# Patient Record
Sex: Female | Born: 1940 | Race: White | Hispanic: No | Marital: Married | Smoking: Former smoker
Health system: Southern US, Community
[De-identification: ages and names within clinical notes are randomized; demographics above are authoritative.]

## PROBLEM LIST (undated history)

## (undated) DIAGNOSIS — E039 Hypothyroidism, unspecified: Secondary | ICD-10-CM

## (undated) DIAGNOSIS — N201 Calculus of ureter: Secondary | ICD-10-CM

## (undated) DIAGNOSIS — F4310 Post-traumatic stress disorder, unspecified: Secondary | ICD-10-CM

## (undated) DIAGNOSIS — F419 Anxiety disorder, unspecified: Secondary | ICD-10-CM

## (undated) DIAGNOSIS — E785 Hyperlipidemia, unspecified: Secondary | ICD-10-CM

## (undated) HISTORY — PX: TONSILLECTOMY: SUR1361

## (undated) HISTORY — PX: APPENDECTOMY: SHX54

---

## 1989-12-28 HISTORY — PX: TOTAL ABDOMINAL HYSTERECTOMY W/ BILATERAL SALPINGOOPHORECTOMY: SHX83

## 2011-04-30 ENCOUNTER — Other Ambulatory Visit (HOSPITAL_COMMUNITY): Payer: Self-pay | Admitting: Family Medicine

## 2011-04-30 DIAGNOSIS — Z139 Encounter for screening, unspecified: Secondary | ICD-10-CM

## 2011-05-04 ENCOUNTER — Ambulatory Visit (HOSPITAL_COMMUNITY)
Admission: RE | Admit: 2011-05-04 | Discharge: 2011-05-04 | Disposition: A | Discharge status: Home / Self Care | Payer: Medicare (Managed Care) | Source: Ambulatory Visit | Attending: Family Medicine | Admitting: Family Medicine

## 2011-05-04 DIAGNOSIS — Z1231 Encounter for screening mammogram for malignant neoplasm of breast: Secondary | ICD-10-CM | POA: Insufficient documentation

## 2011-05-04 DIAGNOSIS — Z139 Encounter for screening, unspecified: Secondary | ICD-10-CM

## 2011-08-26 ENCOUNTER — Telehealth: Payer: Self-pay

## 2011-08-26 NOTE — Telephone Encounter (Signed)
Pt called back. Has family history of colon cancer. OV with Lorenza Burton, NP for 09/02/2011 @ 1:00 PM.

## 2011-08-26 NOTE — Telephone Encounter (Signed)
Called pt. LMOM for a return call.  

## 2011-09-02 ENCOUNTER — Encounter: Payer: Self-pay | Admitting: Urgent Care

## 2011-09-02 ENCOUNTER — Ambulatory Visit (INDEPENDENT_AMBULATORY_CARE_PROVIDER_SITE_OTHER): Payer: Medicare (Managed Care) | Admitting: Urgent Care

## 2011-09-02 VITALS — BP 123/78 | HR 75 | Temp 98.4°F | Ht 67.0 in | Wt 200.4 lb

## 2011-09-02 DIAGNOSIS — Z1211 Encounter for screening for malignant neoplasm of colon: Secondary | ICD-10-CM

## 2011-09-02 NOTE — Progress Notes (Signed)
Referring Provider: Isabella Stalling, MD Primary Care Physician:  Isabella Stalling, MD Primary Gastroenterologist:  Dr. Jena Gauss  Chief Complaint  Patient presents with  . Colonoscopy    mother had colon cancer(passed away)    HPI:  Misty Ward is a 70 y.o. female here as a referral from Dr. Janna Arch for high-risk screening colonoscopy. She tells me her Mom was dx w/ metastatic colon ca age 79.  She has never had a colonoscopy.    Denies any lower GI symptoms including constipation, diarrhea, rectal bleeding, melena or weight loss.  Denies any upper GI symptoms other than rare indigestion about once per month, including nausea, vomiting, dysphagia, odynophagia or anorexia.  Past Medical History  Diagnosis Date  . Hypercholesteremia   . Hypothyroid     Past Surgical History  Procedure Date  . Abdominal hysterectomy     complete  . Appendectomy   . Tonsillectomy     Current Outpatient Prescriptions  Medication Sig Dispense Refill  . beta carotene w/minerals (OCUVITE) tablet Take 1 tablet by mouth daily.        . calcium-vitamin D (OSCAL WITH D) 500-200 MG-UNIT per tablet Take 1 tablet by mouth daily.        . Glucosamine-Chondroitin (OSTEO BI-FLEX REGULAR STRENGTH PO) Take by mouth.        . levothyroxine (SYNTHROID, LEVOTHROID) 75 MCG tablet Take 75 mcg by mouth daily.        Marland Kitchen POTASSIUM PO Take 2 tablets by mouth. OTC      . rosuvastatin (CRESTOR) 10 MG tablet Take 10 mg by mouth daily.          Allergies as of 09/02/2011 - Review Complete 09/02/2011  Allergen Reaction Noted  . Codeine Nausea And Vomiting 09/02/2011  . Penicillins Rash 09/02/2011    Family History  Problem Relation Age of Onset  . Colon cancer Mother   . Pancreatic cancer Father     History   Social History  . Marital Status: Married    Spouse Name: N/A    Number of Children: 3  . Years of Education: N/A   Occupational History  . retired Software engineer    Social History Main Topics    . Smoking status: Former Smoker -- 1.0 packs/day    Types: Cigarettes    Quit date: 09/02/1971  . Smokeless tobacco: Not on file   Comment: quit about 40 yrs ago  . Alcohol Use: No  . Drug Use: No  . Sexually Active: Not on file   Review of Systems: Gen: Denies any fever, chills, sweats, anorexia, fatigue, weakness, malaise, weight loss, and sleep disorder CV: Denies chest pain, angina, palpitations, syncope, orthopnea, PND, peripheral edema, and claudication. Resp: Denies dyspnea at rest, dyspnea with exercise, cough, sputum, wheezing, coughing up blood, and pleurisy. GI: Denies vomiting blood, jaundice, and fecal incontinence.   Denies dysphagia or odynophagia. GU : Denies urinary burning, blood in urine, urinary frequency, urinary hesitancy, nocturnal urination, and urinary incontinence. MS: Denies joint pain, limitation of movement, and swelling, stiffness, low back pain, extremity pain. Denies muscle weakness, cramps, atrophy.  Derm: Bleeding, nonhealing sore to right nasal bridge. She is supposed to see dermatology and has consult pending.  Psych: Denies depression, anxiety, memory loss, suicidal ideation, hallucinations, paranoia, and confusion. Heme: Denies bruising, bleeding, and enlarged lymph nodes.  Physical Exam: BP 123/78  Pulse 75  Temp(Src) 98.4 F (36.9 C) (Temporal)  Ht 5\' 7"  (1.702 m)  Wt 200 lb 6.4 oz (  90.901 kg)  BMI 31.39 kg/m2 General:   Alert,  Well-developed, well-nourished, pleasant and cooperative in NAD Head:  Normocephalic and atraumatic. Eyes:  Sclera clear, no icterus.   Conjunctiva pink. Ears:  Normal auditory acuity. Nose:  Raised, annular, easily friable with depressed center erythematous lesion with waxy border to right nasal bridge.  Mouth:  No deformity or lesions, dentition normal. Neck:  Supple; no masses or thyromegaly. Lungs:  Clear throughout to auscultation.   No wheezes, crackles, or rhonchi. No acute distress. Heart:  Regular rate and  rhythm; no murmurs, clicks, rubs,  or gallops. Abdomen:  Soft, nontender and nondistended. No masses, hepatosplenomegaly or hernias noted. Normal bowel sounds, without guarding, and without rebound.   Rectal:  Deferred until time of colonoscopy.   Msk:  Symmetrical without gross deformities. Normal posture. Pulses:  Normal pulses noted. Extremities:  Without clubbing or edema. Neurologic:  Alert and  oriented x4;  grossly normal neurologically. Skin:  Intact without significant lesions or rashes. Cervical Nodes:  No significant cervical adenopathy. Psych:  Alert and cooperative. Normal mood and affect.

## 2011-09-02 NOTE — Assessment & Plan Note (Addendum)
Misty Ward is a 70 y.o. Caucasian female who has never had a colonoscopy. Her mother has history of metastatic colon cancer at age 21.  I have discussed risks & benefits which include, but are not limited to, bleeding, infection, perforation & drug reaction.  The patient agrees with this plan & written consent will be obtained.    She is somewhat anxious about the procedure and has previously had some bad hospital experiences with her boyfriend, multiple questions were answered, she was reassured about her fears, risks and benefits and agrees to proceed.  She is to follow up with dermatology regarding her nasal skin lesion

## 2011-09-02 NOTE — Progress Notes (Signed)
Cc to PCP 

## 2011-09-18 MED ORDER — SODIUM CHLORIDE 0.45 % IV SOLN
Freq: Once | INTRAVENOUS | Status: AC
  Administered 2011-09-21: 09:00:00 via INTRAVENOUS

## 2011-09-21 ENCOUNTER — Encounter (HOSPITAL_COMMUNITY)
Admission: RE | Disposition: A | Discharge status: Home / Self Care | Payer: Self-pay | Source: Ambulatory Visit | Attending: Internal Medicine

## 2011-09-21 ENCOUNTER — Ambulatory Visit (HOSPITAL_COMMUNITY)
Admission: RE | Admit: 2011-09-21 | Discharge: 2011-09-21 | Disposition: A | Discharge status: Home / Self Care | Payer: Medicare (Managed Care) | Source: Ambulatory Visit | Attending: Internal Medicine | Admitting: Internal Medicine

## 2011-09-21 ENCOUNTER — Encounter (HOSPITAL_COMMUNITY): Payer: Self-pay | Admitting: *Deleted

## 2011-09-21 DIAGNOSIS — K5730 Diverticulosis of large intestine without perforation or abscess without bleeding: Secondary | ICD-10-CM | POA: Insufficient documentation

## 2011-09-21 DIAGNOSIS — E7800 Pure hypercholesterolemia, unspecified: Secondary | ICD-10-CM | POA: Insufficient documentation

## 2011-09-21 DIAGNOSIS — Z1211 Encounter for screening for malignant neoplasm of colon: Secondary | ICD-10-CM | POA: Insufficient documentation

## 2011-09-21 DIAGNOSIS — Z800 Family history of malignant neoplasm of digestive organs: Secondary | ICD-10-CM

## 2011-09-21 HISTORY — DX: Post-traumatic stress disorder, unspecified: F43.10

## 2011-09-21 HISTORY — DX: Anxiety disorder, unspecified: F41.9

## 2011-09-21 HISTORY — PX: COLONOSCOPY: SHX5424

## 2011-09-21 SURGERY — COLONOSCOPY
Anesthesia: Moderate Sedation

## 2011-09-21 MED ORDER — MIDAZOLAM HCL 5 MG/5ML IJ SOLN
INTRAMUSCULAR | Status: DC | PRN
  Administered 2011-09-21: 1 mg via INTRAVENOUS
  Administered 2011-09-21: 2 mg via INTRAVENOUS
  Administered 2011-09-21: 1 mg via INTRAVENOUS

## 2011-09-21 MED ORDER — MEPERIDINE HCL 100 MG/ML IJ SOLN
INTRAMUSCULAR | Status: DC | PRN
  Administered 2011-09-21: 25 mg via INTRAVENOUS
  Administered 2011-09-21: 50 mg via INTRAVENOUS

## 2011-09-21 MED ORDER — MIDAZOLAM HCL 5 MG/5ML IJ SOLN
INTRAMUSCULAR | Status: AC
  Filled 2011-09-21: qty 10

## 2011-09-21 MED ORDER — MEPERIDINE HCL 100 MG/ML IJ SOLN
INTRAMUSCULAR | Status: AC
  Filled 2011-09-21: qty 2

## 2011-09-21 NOTE — H&P (Signed)
Misty Burton, NP  09/02/2011  2:18 PM  Signed Referring Provider: Isabella Stalling, MD Primary Care Physician:  Isabella Stalling, MD Primary Gastroenterologist:  Dr. Jena Gauss    Chief Complaint   Patient presents with   .  Colonoscopy       mother had colon cancer(passed away)      HPI:  Misty Ward is a 70 y.o. female here as a referral from Dr. Janna Arch for high-risk screening colonoscopy. She tells me her Mom was dx w/ metastatic colon ca age 64.  She has never had a colonoscopy.    Denies any lower GI symptoms including constipation, diarrhea, rectal bleeding, melena or weight loss.  Denies any upper GI symptoms other than rare indigestion about once per month, including nausea, vomiting, dysphagia, odynophagia or anorexia.    Past Medical History   Diagnosis  Date   .  Hypercholesteremia     .  Hypothyroid         Past Surgical History   Procedure  Date   .  Abdominal hysterectomy         complete   .  Appendectomy     .  Tonsillectomy         Current Outpatient Prescriptions   Medication  Sig  Dispense  Refill   .  beta carotene w/minerals (OCUVITE) tablet  Take 1 tablet by mouth daily.           .  calcium-vitamin D (OSCAL WITH D) 500-200 MG-UNIT per tablet  Take 1 tablet by mouth daily.           .  Glucosamine-Chondroitin (OSTEO BI-FLEX REGULAR STRENGTH PO)  Take by mouth.           .  levothyroxine (SYNTHROID, LEVOTHROID) 75 MCG tablet  Take 75 mcg by mouth daily.           Marland Kitchen  POTASSIUM PO  Take 2 tablets by mouth. OTC         .  rosuvastatin (CRESTOR) 10 MG tablet  Take 10 mg by mouth daily.               Allergies as of 09/02/2011 - Review Complete 09/02/2011   Allergen  Reaction  Noted   .  Codeine  Nausea And Vomiting  09/02/2011   .  Penicillins  Rash  09/02/2011       Family History   Problem  Relation  Age of Onset   .  Colon cancer  Mother     .  Pancreatic cancer  Father         History       Social History   .  Marital Status:  Married        Spouse Name:  N/A       Number of Children:  3   .  Years of Education:  N/A       Occupational History   .  retired Software engineer         Social History Main Topics   .  Smoking status:  Former Smoker -- 1.0 packs/day       Types:  Cigarettes       Quit date:  09/02/1971   .  Smokeless tobacco:  Not on file     Comment: quit about 40 yrs ago   .  Alcohol Use:  No   .  Drug Use:  No   .  Sexually Active:  Not on  file      Review of Systems: Gen: Denies any fever, chills, sweats, anorexia, fatigue, weakness, malaise, weight loss, and sleep disorder CV: Denies chest pain, angina, palpitations, syncope, orthopnea, PND, peripheral edema, and claudication. Resp: Denies dyspnea at rest, dyspnea with exercise, cough, sputum, wheezing, coughing up blood, and pleurisy. GI: Denies vomiting blood, jaundice, and fecal incontinence.   Denies dysphagia or odynophagia. GU : Denies urinary burning, blood in urine, urinary frequency, urinary hesitancy, nocturnal urination, and urinary incontinence. MS: Denies joint pain, limitation of movement, and swelling, stiffness, low back pain, extremity pain. Denies muscle weakness, cramps, atrophy.   Derm: Bleeding, nonhealing sore to right nasal bridge. She is supposed to see dermatology and has consult pending.   Psych: Denies depression, anxiety, memory loss, suicidal ideation, hallucinations, paranoia, and confusion. Heme: Denies bruising, bleeding, and enlarged lymph nodes.   Physical Exam: BP 123/78  Pulse 75  Temp(Src) 98.4 F (36.9 C) (Temporal)  Ht 5\' 7"  (1.702 m)  Wt 200 lb 6.4 oz (90.901 kg)  BMI 31.39 kg/m2 General:   Alert,  Well-developed, well-nourished, pleasant and cooperative in NAD Head:  Normocephalic and atraumatic. Eyes:  Sclera clear, no icterus.   Conjunctiva pink. Ears:  Normal auditory acuity. Nose:  Raised, annular, easily friable with depressed center erythematous lesion with waxy border to right nasal bridge.    Mouth:  No deformity or lesions, dentition normal. Neck:  Supple; no masses or thyromegaly. Lungs:  Clear throughout to auscultation.   No wheezes, crackles, or rhonchi. No acute distress. Heart:  Regular rate and rhythm; no murmurs, clicks, rubs,  or gallops. Abdomen:  Soft, nontender and nondistended. No masses, hepatosplenomegaly or hernias noted. Normal bowel sounds, without guarding, and without rebound.    Rectal:  Deferred until time of colonoscopy.    Msk:  Symmetrical without gross deformities. Normal posture. Pulses:  Normal pulses noted. Extremities:  Without clubbing or edema. Neurologic:  Alert and  oriented x4;  grossly normal neurologically. Skin:  Intact without significant lesions or rashes. Cervical Nodes:  No significant cervical adenopathy. Psych:  Alert and cooperative. Normal mood and affect.     Misty Ward  09/02/2011  2:55 PM  Signed Cc to PCP        Screening for colon cancer Misty Burton, NP  09/02/2011  2:17 PM  Addendum Misty Ward is a 70 y.o. Caucasian female who has never had a colonoscopy. Her mother has history of metastatic colon cancer at age 29.  I have discussed risks & benefits which include, but are not limited to, bleeding, infection, perforation & drug reaction.  The patient agrees with this plan & written consent will be obtained.     She is somewhat anxious about the procedure and has previously had some bad hospital experiences with her boyfriend, multiple questions were answered, she was reassured about her fears, risks and benefits and agrees to proceed.   I have seen the patient prior to the procedure(s) today and reviewed the history and physical / consultation from 09/02/11.  There have been no changes. After consideration of the risks, benefits, alternatives and imponderables, the patient has consented to the procedure(s).

## 2011-09-25 ENCOUNTER — Encounter (HOSPITAL_COMMUNITY): Payer: Self-pay | Admitting: Internal Medicine

## 2012-10-11 ENCOUNTER — Other Ambulatory Visit (HOSPITAL_COMMUNITY): Payer: Self-pay | Admitting: Family Medicine

## 2012-10-11 DIAGNOSIS — Z139 Encounter for screening, unspecified: Secondary | ICD-10-CM

## 2012-10-13 ENCOUNTER — Ambulatory Visit (HOSPITAL_COMMUNITY)
Admission: RE | Admit: 2012-10-13 | Discharge: 2012-10-13 | Disposition: A | Discharge status: Home / Self Care | Payer: Medicare Other | Source: Ambulatory Visit | Attending: Family Medicine | Admitting: Family Medicine

## 2012-10-13 DIAGNOSIS — Z139 Encounter for screening, unspecified: Secondary | ICD-10-CM

## 2012-10-13 DIAGNOSIS — Z1231 Encounter for screening mammogram for malignant neoplasm of breast: Secondary | ICD-10-CM | POA: Insufficient documentation

## 2015-07-06 ENCOUNTER — Emergency Department (HOSPITAL_COMMUNITY): Payer: Medicare Other

## 2015-07-06 ENCOUNTER — Emergency Department (HOSPITAL_COMMUNITY)
Admission: EM | Admit: 2015-07-06 | Discharge: 2015-07-06 | Disposition: A | Discharge status: Home / Self Care | Payer: Medicare Other | Attending: Emergency Medicine | Admitting: Emergency Medicine

## 2015-07-06 ENCOUNTER — Encounter (HOSPITAL_COMMUNITY): Payer: Self-pay | Admitting: Cardiology

## 2015-07-06 DIAGNOSIS — E039 Hypothyroidism, unspecified: Secondary | ICD-10-CM | POA: Insufficient documentation

## 2015-07-06 DIAGNOSIS — Z79899 Other long term (current) drug therapy: Secondary | ICD-10-CM | POA: Diagnosis not present

## 2015-07-06 DIAGNOSIS — R600 Localized edema: Secondary | ICD-10-CM | POA: Diagnosis not present

## 2015-07-06 DIAGNOSIS — N200 Calculus of kidney: Secondary | ICD-10-CM | POA: Diagnosis not present

## 2015-07-06 DIAGNOSIS — E780 Pure hypercholesterolemia: Secondary | ICD-10-CM | POA: Diagnosis not present

## 2015-07-06 DIAGNOSIS — Z87891 Personal history of nicotine dependence: Secondary | ICD-10-CM | POA: Diagnosis not present

## 2015-07-06 DIAGNOSIS — Z8659 Personal history of other mental and behavioral disorders: Secondary | ICD-10-CM | POA: Insufficient documentation

## 2015-07-06 DIAGNOSIS — Z7982 Long term (current) use of aspirin: Secondary | ICD-10-CM | POA: Insufficient documentation

## 2015-07-06 DIAGNOSIS — R52 Pain, unspecified: Secondary | ICD-10-CM

## 2015-07-06 DIAGNOSIS — R1084 Generalized abdominal pain: Secondary | ICD-10-CM | POA: Diagnosis present

## 2015-07-06 LAB — CBC WITH DIFFERENTIAL/PLATELET
Basophils Absolute: 0 10*3/uL (ref 0.0–0.1)
Basophils Relative: 0 % (ref 0–1)
EOS ABS: 0.1 10*3/uL (ref 0.0–0.7)
EOS PCT: 1 % (ref 0–5)
HEMATOCRIT: 41.5 % (ref 36.0–46.0)
Hemoglobin: 13 g/dL (ref 12.0–15.0)
LYMPHS ABS: 1.3 10*3/uL (ref 0.7–4.0)
LYMPHS PCT: 12 % (ref 12–46)
MCH: 25 pg — AB (ref 26.0–34.0)
MCHC: 31.3 g/dL (ref 30.0–36.0)
MCV: 79.7 fL (ref 78.0–100.0)
MONO ABS: 0.8 10*3/uL (ref 0.1–1.0)
Monocytes Relative: 7 % (ref 3–12)
Neutro Abs: 8.8 10*3/uL — ABNORMAL HIGH (ref 1.7–7.7)
Neutrophils Relative %: 80 % — ABNORMAL HIGH (ref 43–77)
PLATELETS: 313 10*3/uL (ref 150–400)
RBC: 5.21 MIL/uL — AB (ref 3.87–5.11)
RDW: 16.5 % — ABNORMAL HIGH (ref 11.5–15.5)
WBC: 11.1 10*3/uL — ABNORMAL HIGH (ref 4.0–10.5)

## 2015-07-06 LAB — URINE MICROSCOPIC-ADD ON

## 2015-07-06 LAB — URINALYSIS, ROUTINE W REFLEX MICROSCOPIC
Bilirubin Urine: NEGATIVE
GLUCOSE, UA: NEGATIVE mg/dL
KETONES UR: NEGATIVE mg/dL
LEUKOCYTES UA: NEGATIVE
NITRITE: NEGATIVE
PH: 8 (ref 5.0–8.0)
Protein, ur: NEGATIVE mg/dL
SPECIFIC GRAVITY, URINE: 1.02 (ref 1.005–1.030)
Urobilinogen, UA: 0.2 mg/dL (ref 0.0–1.0)

## 2015-07-06 LAB — COMPREHENSIVE METABOLIC PANEL
ALT: 16 U/L (ref 14–54)
ANION GAP: 11 (ref 5–15)
AST: 23 U/L (ref 15–41)
Albumin: 4 g/dL (ref 3.5–5.0)
Alkaline Phosphatase: 77 U/L (ref 38–126)
BUN: 19 mg/dL (ref 6–20)
CALCIUM: 8.8 mg/dL — AB (ref 8.9–10.3)
CO2: 24 mmol/L (ref 22–32)
CREATININE: 0.93 mg/dL (ref 0.44–1.00)
Chloride: 104 mmol/L (ref 101–111)
GFR, EST NON AFRICAN AMERICAN: 60 mL/min — AB (ref >60)
GLUCOSE: 162 mg/dL — AB (ref 65–99)
Potassium: 3.5 mmol/L (ref 3.5–5.1)
SODIUM: 139 mmol/L (ref 135–145)
TOTAL PROTEIN: 7.2 g/dL (ref 6.5–8.1)
Total Bilirubin: 0.7 mg/dL (ref 0.3–1.2)

## 2015-07-06 LAB — LIPASE, BLOOD: Lipase: 20 U/L — ABNORMAL LOW (ref 22–51)

## 2015-07-06 MED ORDER — HYDROMORPHONE HCL 1 MG/ML IJ SOLN
1.0000 mg | Freq: Once | INTRAMUSCULAR | Status: AC
  Administered 2015-07-06: 1 mg via INTRAVENOUS

## 2015-07-06 MED ORDER — TAMSULOSIN HCL 0.4 MG PO CAPS
0.4000 mg | ORAL_CAPSULE | Freq: Once | ORAL | Status: AC
  Administered 2015-07-06: 0.4 mg via ORAL
  Filled 2015-07-06: qty 1

## 2015-07-06 MED ORDER — HYDROMORPHONE HCL 1 MG/ML IJ SOLN
1.0000 mg | Freq: Once | INTRAMUSCULAR | Status: AC
  Administered 2015-07-06: 1 mg via INTRAVENOUS
  Filled 2015-07-06: qty 1

## 2015-07-06 MED ORDER — IOHEXOL 300 MG/ML  SOLN
25.0000 mL | Freq: Once | INTRAMUSCULAR | Status: AC | PRN
  Administered 2015-07-06: 25 mL via ORAL

## 2015-07-06 MED ORDER — OXYCODONE-ACETAMINOPHEN 5-325 MG PO TABS: 1.0000 | ORAL_TABLET | Freq: Four times a day (QID) | ORAL | Status: DC | PRN

## 2015-07-06 MED ORDER — ONDANSETRON HCL 4 MG/2ML IJ SOLN
4.0000 mg | Freq: Once | INTRAMUSCULAR | Status: AC
  Administered 2015-07-06: 4 mg via INTRAVENOUS

## 2015-07-06 MED ORDER — ONDANSETRON 4 MG PO TBDP: ORAL_TABLET | ORAL | Status: DC

## 2015-07-06 MED ORDER — HYDROMORPHONE HCL 1 MG/ML IJ SOLN
INTRAMUSCULAR | Status: AC
  Filled 2015-07-06: qty 1

## 2015-07-06 MED ORDER — HYDROMORPHONE HCL 1 MG/ML IJ SOLN
0.5000 mg | Freq: Once | INTRAMUSCULAR | Status: AC
  Administered 2015-07-06: 0.5 mg via INTRAVENOUS
  Filled 2015-07-06: qty 1

## 2015-07-06 MED ORDER — ONDANSETRON HCL 4 MG/2ML IJ SOLN: 4.0000 mg | Freq: Once | INTRAMUSCULAR | Status: DC

## 2015-07-06 MED ORDER — TAMSULOSIN HCL 0.4 MG PO CAPS: 0.4000 mg | ORAL_CAPSULE | Freq: Every day | ORAL | Status: DC

## 2015-07-06 MED ORDER — IOHEXOL 300 MG/ML  SOLN
100.0000 mL | Freq: Once | INTRAMUSCULAR | Status: AC | PRN
  Administered 2015-07-06: 100 mL via INTRAVENOUS

## 2015-07-06 MED ORDER — SODIUM CHLORIDE 0.9 % IJ SOLN
INTRAMUSCULAR | Status: AC
  Filled 2015-07-06: qty 45

## 2015-07-06 MED ORDER — ONDANSETRON HCL 4 MG/2ML IJ SOLN
4.0000 mg | Freq: Once | INTRAMUSCULAR | Status: AC
  Administered 2015-07-06: 4 mg via INTRAVENOUS
  Filled 2015-07-06: qty 2

## 2015-07-06 MED ORDER — KETOROLAC TROMETHAMINE 30 MG/ML IJ SOLN
15.0000 mg | Freq: Once | INTRAMUSCULAR | Status: AC
  Administered 2015-07-06: 15 mg via INTRAVENOUS
  Filled 2015-07-06: qty 1

## 2015-07-06 MED ORDER — ONDANSETRON HCL 4 MG/2ML IJ SOLN
INTRAMUSCULAR | Status: AC
  Filled 2015-07-06: qty 2

## 2015-07-06 NOTE — ED Provider Notes (Signed)
CSN: [id number]     Arrival date & time 07/06/15  0815 History  This chart was scribed for Misty BerkshireJoseph Nattalie Santiesteban, MD by Tanda RockersMargaux Venter, ED Scribe. This patient was seen in room [facility]08/[facility]08 and the patient's care was started at 8:29 AM.   Chief Complaint  Patient presents with  . Abdominal Pain   Patient is a 74 y.o. female presenting with abdominal pain. The history is provided by the patient. No language interpreter was used.  Abdominal Pain Pain location:  Generalized Pain radiates to:  Does not radiate Pain severity:  Moderate Onset quality:  Sudden Duration:  4 hours Timing:  Constant Progression:  Unchanged Chronicity:  New Relieved by:  None tried Worsened by:  Nothing tried Ineffective treatments:  None tried Associated symptoms: nausea and vomiting   Associated symptoms: no chest pain, no cough, no diarrhea, no fatigue and no hematuria   Risk factors: being elderly      HPI Comments: Misty Ward is a 74 y.o. female who presents to the Emergency Department complaining of sudden onset, moderate, diffuse abdominal pain x 4.5 hours. She states she feels as if she needs to have a bowel movement but is unable to do so. Pt also complains of nausea and vomiting. Pt cannot say if she is having pain radiating into her back. Denies diarrhea, constipation, or any other associated symptoms. PSHx hysterectomy and appendectomy.   Past Medical History  Diagnosis Date  . Hypercholesteremia   . Hypothyroid   . Anxiety   . Post traumatic stress disorder (PTSD)    Past Surgical History  Procedure Laterality Date  . Abdominal hysterectomy      complete  . Appendectomy    . Tonsillectomy    . Colonoscopy  09/21/2011    Procedure: COLONOSCOPY;  Surgeon: Corbin Adeobert M Rourk, MD;  Location: [facility];  Service: Endoscopy;  Laterality: N/A;  9:45   Family History  Problem Relation Age of Onset  . Colon cancer Mother   . Pancreatic cancer Father    History  Substance Use Topics  . Smoking  status: Former Smoker -- 0.50 packs/day for 3 years    Types: Cigarettes    Quit date: 09/02/1971  . Smokeless tobacco: Not on file     Comment: quit about 40 yrs ago  . Alcohol Use: No   OB History    No data available     Review of Systems  Constitutional: Negative for appetite change and fatigue.  HENT: Negative for congestion, ear discharge and sinus pressure.   Eyes: Negative for discharge.  Respiratory: Negative for cough.   Cardiovascular: Negative for chest pain.  Gastrointestinal: Positive for nausea, vomiting and abdominal pain. Negative for diarrhea.  Genitourinary: Negative for frequency and hematuria.  Musculoskeletal: Negative for back pain.  Skin: Negative for rash.  Neurological: Negative for seizures and headaches.  Psychiatric/Behavioral: Negative for hallucinations.      Allergies  Codeine and Penicillins  Home Medications   Prior to Admission medications   Medication Sig Start Date End Date Taking? Authorizing Provider  acetaminophen (TYLENOL) 500 MG tablet Take 1,000 mg by mouth every 6 (six) hours as needed. For pain     Historical Provider, MD  Acetaminophen-Aspirin Buffered (EXCEDRIN BACK & BODY) 250-250 MG tablet Take 2 tablets by mouth daily.      Historical Provider, MD  beta carotene w/minerals (OCUVITE) tablet Take 1 tablet by mouth 2 (two) times daily.     Historical Provider, MD  calcium-vitamin D Ruthell Rummage(OSCAL  WITH D) 500-200 MG-UNIT per tablet Take 1 tablet by mouth 2 (two) times daily.     Historical Provider, MD  Glucosamine-Chondroitin (OSTEO BI-FLEX REGULAR STRENGTH PO) Take 1 tablet by mouth 2 (two) times daily.     Historical Provider, MD  levothyroxine (SYNTHROID, LEVOTHROID) 75 MCG tablet Take 75 mcg by mouth daily.      Historical Provider, MD  POTASSIUM PO Take 2 tablets by mouth daily. OTC    Historical Provider, MD  rosuvastatin (CRESTOR) 10 MG tablet Take 10 mg by mouth daily.      Historical Provider, MD   Triage Vitals: BP 146/74  mmHg  Pulse 69  Temp(Src) 97.7 F (36.5 C) (Oral)  Resp 18  SpO2 98%   Physical Exam  Constitutional: She is oriented to person, place, and time. She appears well-developed.  HENT:  Head: Normocephalic.  Eyes: Conjunctivae and EOM are normal. No scleral icterus.  Neck: Neck supple. No thyromegaly present.  Cardiovascular: Normal rate and regular rhythm.  Exam reveals no gallop and no friction rub.   No murmur heard. Pulmonary/Chest: No stridor. She has no wheezes. She has no rales. She exhibits no tenderness.  Abdominal: She exhibits no distension. There is tenderness. There is no rebound.  Moderate RUQ tenderness  Musculoskeletal: Normal range of motion. She exhibits edema.  1+ edema in ankles bilaterally  Lymphadenopathy:    She has no cervical adenopathy.  Neurological: She is oriented to person, place, and time. She exhibits normal muscle tone. Coordination normal.  Skin: No rash noted. No erythema.  Psychiatric: She has a normal mood and affect. Her behavior is normal.    ED Course  Procedures (including critical care time)  DIAGNOSTIC STUDIES: Oxygen Saturation is 98% on RA, normal by my interpretation.    COORDINATION OF CARE: 8:33 AM-Discussed treatment plan which includes US Abdomen and CT A/P with pt at bedside and pt agreed to plan.   Labs Review Labs Reviewed  CBC WITH DIFFERENTIAL/PLATELET - Abnormal; Notable for the following:    WBC 11.1 (*)    RBC 5.21 (*)    MCH 25.0 (*)    RDW 16.5 (*)    Neutrophils Relative % 80 (*)    Neutro Abs 8.8 (*)    All other components within normal limits  COMPREHENSIVE METABOLIC PANEL - Abnormal; Notable for the following:    Glucose, Bld 162 (*)    Calcium 8.8 (*)    GFR calc non Af Amer 60 (*)    All other components within normal limits  LIPASE, BLOOD - Abnormal; Notable for the following:    Lipase 20 (*)    All other components within normal limits  URINALYSIS, ROUTINE W REFLEX MICROSCOPIC (NOT AT [facility]) -  Abnormal; Notable for the following:    APPearance HAZY (*)    Hgb urine dipstick MODERATE (*)    All other components within normal limits  URINE MICROSCOPIC-ADD ON - Abnormal; Notable for the following:    Squamous Epithelial / LPF FEW (*)    Bacteria, UA FEW (*)    All other components within normal limits    Imaging Review US Abdomen Complete  07/06/2015   CLINICAL DATA:  Pain  EXAM: COMPLETE ABDOMINAL ULTRASOUND  COMPARISON:  CT 07/06/2015  FINDINGS: Gallbladder: Multiple mobile gallstones, some greater than 2 cm. No gallbladder wall thickening or pericholecystic fluid.  Common bile duct:  Normal in caliber, 4.74mm diameter.  Liver: Homogeneous in echotexture without focal lesion or intrahepatic bile duct  dilatation.  IVC:  Negative  Pancreas: Visualized segments unremarkable, portions obscured by overlying bowel gas.  Spleen:  No focal lesion, craniocaudal 7.0cm in length.  Right Kidney:  Mild hydronephrosis.  No mass , 11.7cm in length.  Left Kidney:  No lesion or hydronephrosis, 11.9  cm in length.  Abdominal aorta:  Negative  IMPRESSION: 1. Mild right hydronephrosis. 2. Cholelithiasis   Electronically Signed   By: Corlis Leak M.D.   On: 07/06/2015 11:35   Ct Abdomen Pelvis W Contrast  07/06/2015   CLINICAL DATA:  Patient with sudden onset abdominal pain and vomiting. Prior hysterectomy and appendectomy.  EXAM: CT ABDOMEN AND PELVIS WITH CONTRAST  TECHNIQUE: Multidetector CT imaging of the abdomen and pelvis was performed using the standard protocol following bolus administration of intravenous contrast.  CONTRAST:  25mL OMNIPAQUE IOHEXOL 300 MG/ML SOLN, OMNIPAQUE IOHEXOL 300 MG/ML SOLN  COMPARISON:  None.  FINDINGS: Lower chest: Normal heart size. Dependent atelectasis bilateral lower lobes. No pleural effusion.  Hepatobiliary: Liver is normal in size and contour. Multiple gallstones within the gallbladder lumen without surrounding inflammatory change. No intrahepatic or extrahepatic  biliary ductal dilatation.  Pancreas: Unremarkable  Spleen: Unremarkable  Adrenals/Urinary Tract: The adrenal glands are normal. There is moderate right hydronephrosis to the level of an obstructing proximal right ureteral stone which measures 11 mm. There is delayed enhancement of the right kidney. Fat stranding about the proximal right ureter, right collecting system and right kidney. The distal right ureter is decompressed. Delayed images demonstrate no excretion of contrast into the right renal collecting system. Urinary bladder is unremarkable.  Stomach/Bowel: Sigmoid colonic diverticulosis. No CT evidence for acute diverticulitis. No abnormal bowel wall thickening or evidence for bowel obstruction.  Vascular/Lymphatic: Normal caliber abdominal aorta. No retroperitoneal lymphadenopathy.  Other: None  Musculoskeletal: Lower thoracic and lumbar spine degenerative changes.  IMPRESSION: There is an 11 mm stone within the proximal right ureter which results in high grade obstruction with moderate to severe right hydroureteronephrosis. There is delayed enhancement of the right kidney and no excretion of contrast on delayed images.  Cholelithiasis without CT evidence to suggest acute cholecystitis.  Diverticulosis.  No CT evidence for acute diverticulitis.   Electronically Signed   By: Annia Belt M.D.   On: 07/06/2015 11:15     EKG Interpretation None      MDM   Final diagnoses:  Pain   Kidney stone,  Pt has pain under control.  Spoke with urology which will see pt next week.  rx flomax, percocet and zofra,  Pt to follow up with dr. Lovell Sheehan for gall stones  The chart was scribed for me under my direct supervision.  I personally performed the history, physical, and medical decision making and all procedures in the evaluation of this patient.Misty Berkshire, MD 07/06/15 1256

## 2015-07-06 NOTE — Discharge Instructions (Signed)
Follow up with [facility] next week.  [facility] in [place] if you have problems sunday with fever, vomiting or pain not helped by medicine.  Follow up with dr. Franky MachoMark jenkins at some point to discuss your gall stones

## 2015-07-06 NOTE — ED Notes (Signed)
Abdominal pain and vomiting since 4 am.  

## 2015-07-08 LAB — URINE CULTURE

## 2015-07-12 ENCOUNTER — Ambulatory Visit (HOSPITAL_BASED_OUTPATIENT_CLINIC_OR_DEPARTMENT_OTHER): Payer: Medicare Other | Admitting: Anesthesiology

## 2015-07-12 ENCOUNTER — Encounter (HOSPITAL_BASED_OUTPATIENT_CLINIC_OR_DEPARTMENT_OTHER)
Admission: RE | Disposition: A | Discharge status: Home / Self Care | Payer: Self-pay | Source: Ambulatory Visit | Attending: Urology

## 2015-07-12 ENCOUNTER — Encounter (HOSPITAL_BASED_OUTPATIENT_CLINIC_OR_DEPARTMENT_OTHER): Payer: Self-pay | Admitting: *Deleted

## 2015-07-12 ENCOUNTER — Other Ambulatory Visit: Payer: Self-pay | Admitting: Urology

## 2015-07-12 ENCOUNTER — Ambulatory Visit (HOSPITAL_BASED_OUTPATIENT_CLINIC_OR_DEPARTMENT_OTHER)
Admission: RE | Admit: 2015-07-12 | Discharge: 2015-07-12 | Disposition: A | Discharge status: Home / Self Care | Payer: Medicare Other | Source: Ambulatory Visit | Attending: Urology | Admitting: Urology

## 2015-07-12 DIAGNOSIS — Z87891 Personal history of nicotine dependence: Secondary | ICD-10-CM | POA: Diagnosis not present

## 2015-07-12 DIAGNOSIS — R109 Unspecified abdominal pain: Secondary | ICD-10-CM | POA: Diagnosis present

## 2015-07-12 DIAGNOSIS — N201 Calculus of ureter: Secondary | ICD-10-CM | POA: Insufficient documentation

## 2015-07-12 DIAGNOSIS — E780 Pure hypercholesterolemia: Secondary | ICD-10-CM | POA: Diagnosis not present

## 2015-07-12 DIAGNOSIS — F4310 Post-traumatic stress disorder, unspecified: Secondary | ICD-10-CM | POA: Diagnosis not present

## 2015-07-12 DIAGNOSIS — Z800 Family history of malignant neoplasm of digestive organs: Secondary | ICD-10-CM | POA: Diagnosis not present

## 2015-07-12 DIAGNOSIS — Z87442 Personal history of urinary calculi: Secondary | ICD-10-CM | POA: Insufficient documentation

## 2015-07-12 DIAGNOSIS — E039 Hypothyroidism, unspecified: Secondary | ICD-10-CM | POA: Diagnosis not present

## 2015-07-12 DIAGNOSIS — Z880 Allergy status to penicillin: Secondary | ICD-10-CM | POA: Insufficient documentation

## 2015-07-12 DIAGNOSIS — Z7982 Long term (current) use of aspirin: Secondary | ICD-10-CM | POA: Insufficient documentation

## 2015-07-12 DIAGNOSIS — F419 Anxiety disorder, unspecified: Secondary | ICD-10-CM | POA: Insufficient documentation

## 2015-07-12 DIAGNOSIS — Z888 Allergy status to other drugs, medicaments and biological substances status: Secondary | ICD-10-CM | POA: Insufficient documentation

## 2015-07-12 DIAGNOSIS — Z79899 Other long term (current) drug therapy: Secondary | ICD-10-CM | POA: Diagnosis not present

## 2015-07-12 HISTORY — PX: CYSTOSCOPY WITH STENT PLACEMENT: SHX5790

## 2015-07-12 SURGERY — CYSTOSCOPY, WITH STENT INSERTION
Anesthesia: General | Laterality: Right

## 2015-07-12 MED ORDER — ONDANSETRON HCL 4 MG/2ML IJ SOLN
INTRAMUSCULAR | Status: DC | PRN
  Administered 2015-07-12: 4 mg via INTRAVENOUS

## 2015-07-12 MED ORDER — FENTANYL CITRATE (PF) 100 MCG/2ML IJ SOLN
INTRAMUSCULAR | Status: DC | PRN
  Administered 2015-07-12 (×2): 50 ug via INTRAVENOUS

## 2015-07-12 MED ORDER — METOCLOPRAMIDE HCL 5 MG/ML IJ SOLN
INTRAMUSCULAR | Status: DC | PRN
  Administered 2015-07-12: 10 mg via INTRAVENOUS

## 2015-07-12 MED ORDER — CEFAZOLIN SODIUM-DEXTROSE 2-3 GM-% IV SOLR
2.0000 g | INTRAVENOUS | Status: AC
  Administered 2015-07-12: 2 g via INTRAVENOUS
  Filled 2015-07-12: qty 50

## 2015-07-12 MED ORDER — LACTATED RINGERS IV SOLN
INTRAVENOUS | Status: DC
  Administered 2015-07-12 (×2): via INTRAVENOUS
  Filled 2015-07-12: qty 1000

## 2015-07-12 MED ORDER — CEFAZOLIN SODIUM-DEXTROSE 2-3 GM-% IV SOLR
INTRAVENOUS | Status: AC
  Filled 2015-07-12: qty 50

## 2015-07-12 MED ORDER — GLYCOPYRROLATE 0.2 MG/ML IJ SOLN
INTRAMUSCULAR | Status: DC | PRN
  Administered 2015-07-12: 0.2 mg via INTRAVENOUS

## 2015-07-12 MED ORDER — SODIUM CHLORIDE 0.9 % IR SOLN
Status: DC | PRN
  Administered 2015-07-12: 4000 mL via INTRAVESICAL

## 2015-07-12 MED ORDER — OXYCODONE-ACETAMINOPHEN 5-325 MG PO TABS: 1.0000 | ORAL_TABLET | Freq: Four times a day (QID) | ORAL | Status: DC | PRN

## 2015-07-12 MED ORDER — ONDANSETRON HCL 4 MG/2ML IJ SOLN
4.0000 mg | Freq: Once | INTRAMUSCULAR | Status: DC | PRN
  Filled 2015-07-12: qty 2

## 2015-07-12 MED ORDER — FENTANYL CITRATE (PF) 100 MCG/2ML IJ SOLN
INTRAMUSCULAR | Status: AC
  Filled 2015-07-12: qty 2

## 2015-07-12 MED ORDER — MIDAZOLAM HCL 2 MG/2ML IJ SOLN
INTRAMUSCULAR | Status: AC
  Filled 2015-07-12: qty 2

## 2015-07-12 MED ORDER — TAMSULOSIN HCL 0.4 MG PO CAPS: 0.4000 mg | ORAL_CAPSULE | Freq: Every day | ORAL | Status: DC

## 2015-07-12 MED ORDER — KETOROLAC TROMETHAMINE 30 MG/ML IJ SOLN
INTRAMUSCULAR | Status: DC | PRN
  Administered 2015-07-12: 30 mg via INTRAVENOUS

## 2015-07-12 MED ORDER — DEXAMETHASONE SODIUM PHOSPHATE 4 MG/ML IJ SOLN
INTRAMUSCULAR | Status: DC | PRN
  Administered 2015-07-12: 10 mg via INTRAVENOUS

## 2015-07-12 MED ORDER — ACETAMINOPHEN 10 MG/ML IV SOLN
INTRAVENOUS | Status: DC | PRN
  Administered 2015-07-12: 1000 mg via INTRAVENOUS

## 2015-07-12 MED ORDER — SUCCINYLCHOLINE CHLORIDE 20 MG/ML IJ SOLN
INTRAMUSCULAR | Status: DC | PRN
  Administered 2015-07-12: 100 mg via INTRAVENOUS

## 2015-07-12 MED ORDER — CEFAZOLIN SODIUM 1-5 GM-% IV SOLN
1.0000 g | INTRAVENOUS | Status: DC
  Filled 2015-07-12: qty 50

## 2015-07-12 MED ORDER — EPHEDRINE SULFATE 50 MG/ML IJ SOLN
INTRAMUSCULAR | Status: DC | PRN
  Administered 2015-07-12 (×2): 10 mg via INTRAVENOUS

## 2015-07-12 MED ORDER — FENTANYL CITRATE (PF) 100 MCG/2ML IJ SOLN
25.0000 ug | INTRAMUSCULAR | Status: DC | PRN
  Filled 2015-07-12: qty 1

## 2015-07-12 MED ORDER — LIDOCAINE HCL (CARDIAC) 20 MG/ML IV SOLN
INTRAVENOUS | Status: DC | PRN
  Administered 2015-07-12: 100 mg via INTRAVENOUS

## 2015-07-12 MED ORDER — PROPOFOL 10 MG/ML IV BOLUS
INTRAVENOUS | Status: DC | PRN
  Administered 2015-07-12: 200 mg via INTRAVENOUS

## 2015-07-12 MED ORDER — IOHEXOL 350 MG/ML SOLN
INTRAVENOUS | Status: DC | PRN
  Administered 2015-07-12: 1 mL via URETHRAL

## 2015-07-12 SURGICAL SUPPLY — 24 items
BAG URO CATCHER STRL LF (DRAPE) ×3 IMPLANT
BASKET LASER NITINOL 1.9FR (BASKET) IMPLANT
BASKET STONE 1.7 NGAGE (UROLOGICAL SUPPLIES) IMPLANT
BASKET ZERO TIP NITINOL 2.4FR (BASKET) IMPLANT
CANISTER SUCT LVC 12 LTR MEDI- (MISCELLANEOUS) IMPLANT
CATH INTERMIT  6FR 70CM (CATHETERS) ×3 IMPLANT
CLOTH BEACON ORANGE TIMEOUT ST (SAFETY) ×3 IMPLANT
FIBER LASER FLEXIVA 365 (UROLOGICAL SUPPLIES) IMPLANT
FIBER LASER TRAC TIP (UROLOGICAL SUPPLIES) IMPLANT
GLOVE BIO SURGEON STRL SZ8 (GLOVE) ×3 IMPLANT
GOWN STRL REUS W/ TWL LRG LVL3 (GOWN DISPOSABLE) ×1 IMPLANT
GOWN STRL REUS W/ TWL XL LVL3 (GOWN DISPOSABLE) ×1 IMPLANT
GOWN STRL REUS W/TWL LRG LVL3 (GOWN DISPOSABLE) ×2
GOWN STRL REUS W/TWL XL LVL3 (GOWN DISPOSABLE) ×2
GUIDEWIRE ANG ZIPWIRE 038X150 (WIRE) ×3 IMPLANT
GUIDEWIRE STR DUAL SENSOR (WIRE) IMPLANT
IV NS 1000ML (IV SOLUTION) ×2
IV NS 1000ML BAXH (IV SOLUTION) ×1 IMPLANT
IV NS IRRIG 3000ML ARTHROMATIC (IV SOLUTION) ×3 IMPLANT
MANIFOLD NEPTUNE II (INSTRUMENTS) ×3 IMPLANT
PACK CYSTO (CUSTOM PROCEDURE TRAY) ×3 IMPLANT
STENT URET 6FRX26 CONTOUR (STENTS) ×3 IMPLANT
SYRINGE 10CC LL (SYRINGE) ×3 IMPLANT
TUBE FEEDING 8FR 16IN STR KANG (MISCELLANEOUS) IMPLANT

## 2015-07-12 NOTE — Anesthesia Procedure Notes (Addendum)
Procedure Name: Intubation Date/Time: 07/12/2015 1:47 PM Performed by: Jessica PriestBEESON, Sanjay Broadfoot C Pre-anesthesia Checklist: Patient identified, Emergency Drugs available, Suction available and Patient being monitored Patient Re-evaluated:Patient Re-evaluated prior to inductionOxygen Delivery Method: Circle System Utilized Preoxygenation: Pre-oxygenation with 100% oxygen Intubation Type: IV induction Ventilation: Mask ventilation without difficulty Laryngoscope Size: Mac and 3 Grade View: Grade II Tube type: Oral Tube size: 7.0 mm Number of attempts: 1 Airway Equipment and Method: Stylet and Oral airway Placement Confirmation: ETT inserted through vocal cords under direct vision,  positive ETCO2 and breath sounds checked- equal and bilateral Secured at: 20 (20) cm Tube secured with: Tape Dental Injury: Teeth and Oropharynx as per pre-operative assessment  Comments: Pt in correct sniffing position with gray wedge shoulder support and blankets with yellow gel head rest and Pre O 2 100 % - smooth IV induction / intubation  8.0 nasal trumpet Dr Gentry RochJudd inserted in left nare for emergence

## 2015-07-12 NOTE — Brief Op Note (Signed)
07/12/2015  2:14 PM  PATIENT:  Misty Ward  74 y.o. female  PRE-OPERATIVE DIAGNOSIS:  right ureteral stone   POST-OPERATIVE DIAGNOSIS: right ureteral stone  PROCEDURE:  Procedure(s) with comments: CYSTOSCOPY WITH STENT PLACEMENT (Right) - c arm   cyst, right ureteral stent placement   SURGEON:  Surgeon(s) and Role:    * Malen GauzePatrick L Enda Santo, MD - Primary  PHYSICIAN ASSISTANT:   ASSISTANTS: none   ANESTHESIA:   general  EBL:  Total I/O In: 300 [I.V.:300] Out: -   BLOOD ADMINISTERED:none  DRAINS: 6x26 JJ ureteral stent without tether  LOCAL MEDICATIONS USED:  NONE  SPECIMEN:  No Specimen  DISPOSITION OF SPECIMEN:  N/A  COUNTS:  YES  TOURNIQUET:  * No tourniquets in log *  DICTATION: .Note written in EPIC  PLAN OF CARE: Discharge to home after PACU  PATIENT DISPOSITION:  PACU - hemodynamically stable.   Delay start of Pharmacological VTE agent (>24hrs) due to surgical blood loss or risk of bleeding: not applicable

## 2015-07-12 NOTE — Anesthesia Postprocedure Evaluation (Signed)
  Anesthesia Post-op Note  Patient: Misty Ward  Procedure(s) Performed: Procedure(s) with comments: CYSTOSCOPY WITH STENT PLACEMENT (Right) - c arm   cyst, right ureteral stent placement   Patient Location: PACU  Anesthesia Type:General  Level of Consciousness: awake, alert , oriented and patient cooperative  Airway and Oxygen Therapy: Patient Spontanous Breathing  Post-op Pain: none  Post-op Assessment: Post-op Vital signs reviewed, Patient's Cardiovascular Status Stable, Respiratory Function Stable, Patent Airway, No signs of Nausea or vomiting and Pain level controlled              Post-op Vital Signs: Reviewed and stable  Last Vitals:  Filed Vitals:   07/12/15 1506  BP:   Pulse: 90  Temp:   Resp: 15    Complications: No apparent anesthesia complications

## 2015-07-12 NOTE — H&P (Signed)
Urology Admission H&P  Chief Complaint: right flank pain  History of Present Illness: Ms Misty Ward is a 74yo with a known R 1.2cm UPJ stone here for ureteral stent placement. This is her first stone event. She has sharp, severe, intermittent, nonradiating right flank pain for 2 days. She denies fevers/chills/sweats  Past Medical History  Diagnosis Date  . Hypercholesteremia   . Hypothyroid   . Anxiety   . Post traumatic stress disorder (PTSD)    Past Surgical History  Procedure Laterality Date  . Abdominal hysterectomy      complete  . Appendectomy    . Tonsillectomy    . Colonoscopy  09/21/2011    Procedure: COLONOSCOPY;  Surgeon: Corbin Adeobert M Rourk, MD;  Location: [facility];  Service: Endoscopy;  Laterality: N/A;  9:45    Home Medications:  Prescriptions prior to admission  Medication Sig Dispense Refill Last Dose  . beta carotene w/minerals (OCUVITE) tablet Take 1 tablet by mouth 2 (two) times daily.    Past Week at Unknown time  . calcium-vitamin D (OSCAL WITH D) 500-200 MG-UNIT per tablet Take 1 tablet by mouth 2 (two) times daily.    Past Week at Unknown time  . citalopram (CELEXA) 20 MG tablet Take 1 tablet by mouth daily.   07/11/2015 at Unknown time  . Glucosamine-Chondroitin (OSTEO BI-FLEX REGULAR STRENGTH PO) Take 1 tablet by mouth 2 (two) times daily.    Past Week at Unknown time  . levothyroxine (SYNTHROID, LEVOTHROID) 75 MCG tablet Take 75 mcg by mouth daily.     07/11/2015 at Unknown time  . ondansetron (ZOFRAN ODT) 4 MG disintegrating tablet 4mg  ODT q4 hours prn nausea/vomit 12 tablet 0 07/12/2015 at 1000  . oxyCODONE-acetaminophen (PERCOCET/ROXICET) 5-325 MG per tablet Take 1 tablet by mouth every 6 (six) hours as needed. 20 tablet 0 07/12/2015  . POTASSIUM PO Take 2 tablets by mouth daily. OTC   Past Week at Unknown time  . QUEtiapine (SEROQUEL) 200 MG tablet Take 1 tablet by mouth daily.   07/11/2015 at Unknown time  . simvastatin (ZOCOR) 20 MG tablet Take 1 tablet by  mouth at bedtime.   07/11/2015 at Unknown time  . tamsulosin (FLOMAX) 0.4 MG CAPS capsule Take 1 capsule (0.4 mg total) by mouth daily. 10 capsule 0 07/11/2015 at Unknown time  . acetaminophen (TYLENOL) 500 MG tablet Take 1,000 mg by mouth every 6 (six) hours as needed. For pain    Unknown at Unknown time  . Acetaminophen-Aspirin Buffered (EXCEDRIN BACK & BODY) 250-250 MG tablet Take 2 tablets by mouth daily.     Unknown at Unknown time  . ibuprofen (ADVIL,MOTRIN) 200 MG tablet Take 400 mg by mouth every 6 (six) hours as needed for moderate pain.   Unknown at Unknown time   Allergies:  Allergies  Allergen Reactions  . Codeine Nausea And Vomiting  . Penicillins Rash    Family History  Problem Relation Age of Onset  . Colon cancer Mother   . Pancreatic cancer Father    Social History:  reports that she quit smoking about 43 years ago. Her smoking use included Cigarettes. She has a 1.5 pack-year smoking history. She does not have any smokeless tobacco history on file. She reports that she does not drink alcohol or use illicit drugs.  Review of Systems  Gastrointestinal: Positive for nausea, vomiting and abdominal pain.  Genitourinary: Positive for dysuria, urgency, frequency and flank pain.  All other systems reviewed and are negative.   Physical  Exam:  Vital signs in last 24 hours: Temp:  [98.3 F (36.8 C)] 98.3 F (36.8 C) (07/15 1156) Pulse Rate:  [76] 76 (07/15 1156) Resp:  [12] 12 (07/15 1156) BP: (135)/(71) 135/71 mmHg (07/15 1156) SpO2:  [99 %] 99 % (07/15 1156) Weight:  [99.791 kg (220 lb)] 99.791 kg (220 lb) (07/15 1156) Physical Exam  Constitutional: She is oriented to person, place, and time. She appears well-developed and well-nourished.  HENT:  Head: Normocephalic and atraumatic.  Eyes: EOM are normal.  Neck: Normal range of motion. Neck supple. No thyromegaly present.  Cardiovascular: Normal rate and regular rhythm.   Respiratory: Effort normal and breath sounds  normal.  GI: Soft. She exhibits no distension. There is no tenderness.  Musculoskeletal: Normal range of motion.  Neurological: She is alert and oriented to person, place, and time.  Skin: Skin is warm and dry.  Psychiatric: She has a normal mood and affect. Her behavior is normal. Judgment and thought content normal.    Laboratory Data:  No results found for this or any previous visit (from the past 24 hour(s)). Recent Results (from the past 240 hour(s))  Urine culture     Status: None   Collection Time: 07/06/15  9:20 AM  Result Value Ref Range Status   Specimen Description URINE, CLEAN CATCH  Final   Special Requests NONE  Final   Culture   Final    MULTIPLE SPECIES PRESENT, SUGGEST RECOLLECTION IF CLINICALLY INDICATED Performed at [facility]    Report Status 07/08/2015 FINAL  Final   Creatinine:  Recent Labs  07/06/15 0835  CREATININE 0.93     Impression/Assessment:  R ureteral stone  Plan:  Risks/benefits/alternatives to right ureteral stent placement was explained to the patient and she understands and wishes to proceed with surgery.  Misty Ward L 07/12/2015, 1:15 PM

## 2015-07-12 NOTE — Op Note (Signed)
Preoperative diagnosis: Right ureteral stone  Postoperative diagnosis: Same  Procedure: 1 cystoscopy 2. right retrograde pyelography 3.  Intraoperative fluoroscopy, under one hour, with interpretation 4.  Right 6 x 26 JJ stent placement  Attending: Cleda MccreedyPatrick Mackenzie  Anesthesia: General  Estimated blood loss: None  Drains: Right 6 x 26 JJ ureteral stent without tether  Specimens: none  Antibiotics: ancef  Findings: no masses/lesions in urethra or bladder. Ureteral orifices in normal anatomic location. 1cm proximal ureteral stop with severe proximal hydronephrosis.  Indications: Patient is a 74 year old female with a history of right ureteral stone an intractable pain.  After discussing treatment options, she decided proceed with right ureteral stent placement.  Procedure her in detail: The patient was brought to the operating room and a brief timeout was done to ensure correct patient, correct procedure, correct site.  General anesthesia was administered patient was placed in dorsal lithotomy position.  Her genitalia was then prepped and draped in usual sterile fashion.  A rigid 22 French cystoscope was passed in the urethra and the bladder.  Bladder was inspected free masses or lesions.  the right ureteral orifices were in the normal orthotopic locations.  a 6 french ureteral catheter was then instilled into the right ureter orifice.  a gentle retrograde was obtained and findings noted above.  we then placed a zip wire through the ureteral catheter and advanced up to the renal pelvis.  We then advanced a ureteral stent over the zip wire and advanced it up to the renal pelvis. The wire was then removed and good coiling was noted in the renal pelvic under fluoroscopy and int he bladder under direct vision   the bladder was then drained and this concluded the procedure which was well tolerated by patient.  Complications: None  Condition: Stable, extubated, transferred to PACU  Plan:  Patient is to be discharged home as to follow-up in 2 weeks for right ureteroscopic stone extraction.

## 2015-07-12 NOTE — Anesthesia Preprocedure Evaluation (Signed)
Anesthesia Evaluation  Patient identified by MRN, date of birth, ID band Patient awake    Reviewed: Allergy & Precautions, NPO status , Patient's Chart, lab work & pertinent test results  History of Anesthesia Complications Negative for: history of anesthetic complications  Airway Mallampati: II  TM Distance: >3 FB Neck ROM: Full    Dental no notable dental hx. (+) Dental Advisory Given, Partial Upper   Pulmonary former smoker,  breath sounds clear to auscultation  Pulmonary exam normal       Cardiovascular negative cardio ROS Normal cardiovascular examRhythm:Regular Rate:Normal     Neuro/Psych PSYCHIATRIC DISORDERS Anxiety negative neurological ROS     GI/Hepatic negative GI ROS, Neg liver ROS,   Endo/Other  Hypothyroidism Obesity   Renal/GU negative Renal ROS  negative genitourinary   Musculoskeletal negative musculoskeletal ROS (+)   Abdominal   Peds negative pediatric ROS (+)  Hematology negative hematology ROS (+)   Anesthesia Other Findings   Reproductive/Obstetrics negative OB ROS                             Anesthesia Physical Anesthesia Plan  ASA: II  Anesthesia Plan: General   Post-op Pain Management:    Induction: Intravenous  Airway Management Planned: Oral ETT  Additional Equipment:   Intra-op Plan:   Post-operative Plan: Extubation in OR  Informed Consent: I have reviewed the patients History and Physical, chart, labs and discussed the procedure including the risks, benefits and alternatives for the proposed anesthesia with the patient or authorized representative who has indicated his/her understanding and acceptance.   Dental advisory given  Plan Discussed with: CRNA  Anesthesia Plan Comments:         Anesthesia Quick Evaluation

## 2015-07-12 NOTE — Transfer of Care (Signed)
Immediate Anesthesia Transfer of Care Note  Patient: Misty Ward  Procedure(s) Performed: Procedure(s) (LRB): CYSTOSCOPY WITH STENT PLACEMENT (Right)  Patient Location: PACU  Anesthesia Type: General  Level of Consciousness: awake, sedated, patient cooperative and responds to stimulation  Airway & Oxygen Therapy: Patient Spontanous Breathing and Patient connected to face mask oxygen  Post-op Assessment: Report given to PACU RN, Post -op Vital signs reviewed and stable and Patient moving all extremities  Post vital signs: Reviewed and stable  Complications: No apparent anesthesia complications

## 2015-07-12 NOTE — Discharge Instructions (Signed)

## 2015-07-15 ENCOUNTER — Other Ambulatory Visit: Payer: Self-pay | Admitting: Urology

## 2015-07-15 ENCOUNTER — Encounter (HOSPITAL_BASED_OUTPATIENT_CLINIC_OR_DEPARTMENT_OTHER): Payer: Self-pay | Admitting: Urology

## 2015-07-18 ENCOUNTER — Encounter (HOSPITAL_BASED_OUTPATIENT_CLINIC_OR_DEPARTMENT_OTHER): Payer: Self-pay | Admitting: *Deleted

## 2015-07-18 NOTE — Progress Notes (Signed)
NPO AFTER MN WITH EXCEPTION CLEAR LIQUIDS UNTIL 0730 (NO CREAM/ MILK PRODUCTS).  ARRIVE AT 1215.  CURRENT LAB RESULTS IN CHART AND EPIC. WILL TAKE AM MEDS W/ SIPS OF WATER AND IF NEEDED TAKE OXYCODONE/ ZOFRAN .

## 2015-07-19 MED ORDER — MIDAZOLAM HCL 2 MG/2ML IJ SOLN
INTRAMUSCULAR | Status: AC
  Filled 2015-07-19: qty 2

## 2015-07-19 MED ORDER — FENTANYL CITRATE (PF) 100 MCG/2ML IJ SOLN
INTRAMUSCULAR | Status: AC
  Filled 2015-07-19: qty 4

## 2015-07-22 ENCOUNTER — Encounter (HOSPITAL_BASED_OUTPATIENT_CLINIC_OR_DEPARTMENT_OTHER): Payer: Self-pay

## 2015-07-22 ENCOUNTER — Ambulatory Visit (HOSPITAL_BASED_OUTPATIENT_CLINIC_OR_DEPARTMENT_OTHER): Payer: Medicare Other | Admitting: Anesthesiology

## 2015-07-22 ENCOUNTER — Encounter (HOSPITAL_BASED_OUTPATIENT_CLINIC_OR_DEPARTMENT_OTHER)
Admission: RE | Disposition: A | Discharge status: Home / Self Care | Payer: Self-pay | Source: Ambulatory Visit | Attending: Urology

## 2015-07-22 ENCOUNTER — Ambulatory Visit (HOSPITAL_BASED_OUTPATIENT_CLINIC_OR_DEPARTMENT_OTHER)
Admission: RE | Admit: 2015-07-22 | Discharge: 2015-07-22 | Disposition: A | Discharge status: Home / Self Care | Payer: Medicare Other | Source: Ambulatory Visit | Attending: Urology | Admitting: Urology

## 2015-07-22 DIAGNOSIS — E669 Obesity, unspecified: Secondary | ICD-10-CM | POA: Insufficient documentation

## 2015-07-22 DIAGNOSIS — Z888 Allergy status to other drugs, medicaments and biological substances status: Secondary | ICD-10-CM | POA: Insufficient documentation

## 2015-07-22 DIAGNOSIS — F419 Anxiety disorder, unspecified: Secondary | ICD-10-CM | POA: Insufficient documentation

## 2015-07-22 DIAGNOSIS — Z800 Family history of malignant neoplasm of digestive organs: Secondary | ICD-10-CM

## 2015-07-22 DIAGNOSIS — Z880 Allergy status to penicillin: Secondary | ICD-10-CM

## 2015-07-22 DIAGNOSIS — E039 Hypothyroidism, unspecified: Secondary | ICD-10-CM | POA: Diagnosis present

## 2015-07-22 DIAGNOSIS — R509 Fever, unspecified: Secondary | ICD-10-CM | POA: Diagnosis not present

## 2015-07-22 DIAGNOSIS — Z87891 Personal history of nicotine dependence: Secondary | ICD-10-CM | POA: Insufficient documentation

## 2015-07-22 DIAGNOSIS — E0590 Thyrotoxicosis, unspecified without thyrotoxic crisis or storm: Secondary | ICD-10-CM

## 2015-07-22 DIAGNOSIS — Z79899 Other long term (current) drug therapy: Secondary | ICD-10-CM

## 2015-07-22 DIAGNOSIS — Z6833 Body mass index (BMI) 33.0-33.9, adult: Secondary | ICD-10-CM

## 2015-07-22 DIAGNOSIS — N10 Acute tubulo-interstitial nephritis: Secondary | ICD-10-CM | POA: Diagnosis not present

## 2015-07-22 DIAGNOSIS — R109 Unspecified abdominal pain: Secondary | ICD-10-CM | POA: Insufficient documentation

## 2015-07-22 DIAGNOSIS — N201 Calculus of ureter: Secondary | ICD-10-CM | POA: Insufficient documentation

## 2015-07-22 DIAGNOSIS — A419 Sepsis, unspecified organism: Secondary | ICD-10-CM | POA: Diagnosis not present

## 2015-07-22 DIAGNOSIS — N202 Calculus of kidney with calculus of ureter: Secondary | ICD-10-CM | POA: Diagnosis not present

## 2015-07-22 DIAGNOSIS — Z87442 Personal history of urinary calculi: Secondary | ICD-10-CM

## 2015-07-22 DIAGNOSIS — B965 Pseudomonas (aeruginosa) (mallei) (pseudomallei) as the cause of diseases classified elsewhere: Secondary | ICD-10-CM | POA: Diagnosis present

## 2015-07-22 DIAGNOSIS — B952 Enterococcus as the cause of diseases classified elsewhere: Secondary | ICD-10-CM | POA: Diagnosis present

## 2015-07-22 DIAGNOSIS — F4310 Post-traumatic stress disorder, unspecified: Secondary | ICD-10-CM | POA: Diagnosis present

## 2015-07-22 DIAGNOSIS — Z66 Do not resuscitate: Secondary | ICD-10-CM | POA: Diagnosis present

## 2015-07-22 DIAGNOSIS — E785 Hyperlipidemia, unspecified: Secondary | ICD-10-CM | POA: Diagnosis present

## 2015-07-22 HISTORY — PX: HOLMIUM LASER APPLICATION: SHX5852

## 2015-07-22 HISTORY — DX: Hypothyroidism, unspecified: E03.9

## 2015-07-22 HISTORY — PX: CYSTOSCOPY W/ URETERAL STENT PLACEMENT: SHX1429

## 2015-07-22 HISTORY — DX: Calculus of ureter: N20.1

## 2015-07-22 HISTORY — DX: Hyperlipidemia, unspecified: E78.5

## 2015-07-22 HISTORY — PX: CYSTOSCOPY/RETROGRADE/URETEROSCOPY/STONE EXTRACTION WITH BASKET: SHX5317

## 2015-07-22 SURGERY — CYSTOSCOPY, WITH CALCULUS REMOVAL USING BASKET
Anesthesia: General | Site: Ureter | Laterality: Right

## 2015-07-22 MED ORDER — OXYCODONE-ACETAMINOPHEN 5-325 MG PO TABS: 1.0000 | ORAL_TABLET | Freq: Four times a day (QID) | ORAL | Status: DC | PRN

## 2015-07-22 MED ORDER — PROPOFOL 10 MG/ML IV BOLUS
INTRAVENOUS | Status: DC | PRN
  Administered 2015-07-22: 200 mg via INTRAVENOUS

## 2015-07-22 MED ORDER — FENTANYL CITRATE (PF) 100 MCG/2ML IJ SOLN
INTRAMUSCULAR | Status: AC
  Filled 2015-07-22: qty 4

## 2015-07-22 MED ORDER — KETOROLAC TROMETHAMINE 15 MG/ML IJ SOLN
INTRAMUSCULAR | Status: DC | PRN
  Administered 2015-07-22: 15 mg via INTRAVENOUS

## 2015-07-22 MED ORDER — ACETAMINOPHEN 10 MG/ML IV SOLN
INTRAVENOUS | Status: DC | PRN
  Administered 2015-07-22: 1000 mg via INTRAVENOUS

## 2015-07-22 MED ORDER — LIDOCAINE HCL (CARDIAC) 20 MG/ML IV SOLN
INTRAVENOUS | Status: DC | PRN
  Administered 2015-07-22: 80 mg via INTRAVENOUS

## 2015-07-22 MED ORDER — LACTATED RINGERS IV SOLN
INTRAVENOUS | Status: DC
  Administered 2015-07-22 (×2): via INTRAVENOUS
  Filled 2015-07-22: qty 1000

## 2015-07-22 MED ORDER — CEFTRIAXONE SODIUM 1 G IJ SOLR
INTRAMUSCULAR | Status: AC
Start: 2015-07-22 — End: 2015-07-22
  Filled 2015-07-22: qty 10

## 2015-07-22 MED ORDER — PHENYLEPHRINE HCL 10 MG/ML IJ SOLN
INTRAMUSCULAR | Status: DC | PRN
  Administered 2015-07-22: 80 ug via INTRAVENOUS

## 2015-07-22 MED ORDER — DEXAMETHASONE SODIUM PHOSPHATE 4 MG/ML IJ SOLN
INTRAMUSCULAR | Status: DC | PRN
  Administered 2015-07-22: 5 mg via INTRAVENOUS

## 2015-07-22 MED ORDER — FENTANYL CITRATE (PF) 100 MCG/2ML IJ SOLN
INTRAMUSCULAR | Status: DC | PRN
  Administered 2015-07-22 (×2): 25 ug via INTRAVENOUS
  Administered 2015-07-22: 50 ug via INTRAVENOUS

## 2015-07-22 MED ORDER — ONDANSETRON HCL 4 MG/2ML IJ SOLN
INTRAMUSCULAR | Status: DC | PRN
  Administered 2015-07-22: 4 mg via INTRAVENOUS

## 2015-07-22 MED ORDER — CEFTRIAXONE SODIUM IN DEXTROSE 20 MG/ML IV SOLN
1.0000 g | INTRAVENOUS | Status: AC
  Administered 2015-07-22: 2 g via INTRAVENOUS
  Filled 2015-07-22: qty 50

## 2015-07-22 MED ORDER — IOHEXOL 300 MG/ML  SOLN
INTRAMUSCULAR | Status: DC | PRN
  Administered 2015-07-22: 10 mL

## 2015-07-22 MED ORDER — SODIUM CHLORIDE 0.9 % IR SOLN
Status: DC | PRN
  Administered 2015-07-22: 1000 mL
  Administered 2015-07-22: 3000 mL

## 2015-07-22 SURGICAL SUPPLY — 26 items
BAG URO CATCHER STRL LF (DRAPE) ×3 IMPLANT
BASKET LASER NITINOL 1.9FR (BASKET) IMPLANT
BASKET STONE 1.7 NGAGE (UROLOGICAL SUPPLIES) ×6 IMPLANT
BASKET ZERO TIP NITINOL 2.4FR (BASKET) IMPLANT
CANISTER SUCT LVC 12 LTR MEDI- (MISCELLANEOUS) IMPLANT
CATH INTERMIT  6FR 70CM (CATHETERS) ×3 IMPLANT
CLOTH BEACON ORANGE TIMEOUT ST (SAFETY) ×3 IMPLANT
EXTRACTOR STONE NITINOL NGAGE (UROLOGICAL SUPPLIES) ×3 IMPLANT
FIBER LASER FLEXIVA 365 (UROLOGICAL SUPPLIES) IMPLANT
FIBER LASER TRAC TIP (UROLOGICAL SUPPLIES) ×3 IMPLANT
GLOVE BIO SURGEON STRL SZ8 (GLOVE) ×3 IMPLANT
GLOVE SURG SS PI 7.5 STRL IVOR (GLOVE) ×6 IMPLANT
GOWN STRL REUS W/ TWL LRG LVL3 (GOWN DISPOSABLE) ×1 IMPLANT
GOWN STRL REUS W/ TWL XL LVL3 (GOWN DISPOSABLE) ×1 IMPLANT
GOWN STRL REUS W/TWL LRG LVL3 (GOWN DISPOSABLE) ×2
GOWN STRL REUS W/TWL XL LVL3 (GOWN DISPOSABLE) ×2
GUIDEWIRE ANG ZIPWIRE 038X150 (WIRE) ×3 IMPLANT
GUIDEWIRE STR DUAL SENSOR (WIRE) ×3 IMPLANT
IV NS 1000ML (IV SOLUTION) ×2
IV NS 1000ML BAXH (IV SOLUTION) ×1 IMPLANT
IV NS IRRIG 3000ML ARTHROMATIC (IV SOLUTION) ×3 IMPLANT
MANIFOLD NEPTUNE II (INSTRUMENTS) ×3 IMPLANT
PACK CYSTO (CUSTOM PROCEDURE TRAY) ×3 IMPLANT
STENT URET 6FRX26 CONTOUR (STENTS) ×3 IMPLANT
SYRINGE 10CC LL (SYRINGE) ×3 IMPLANT
TUBE FEEDING 8FR 16IN STR KANG (MISCELLANEOUS) IMPLANT

## 2015-07-22 NOTE — Anesthesia Procedure Notes (Signed)
Procedure Name: LMA Insertion Date/Time: 07/22/2015 1:23 PM Performed by: Maris Berger T Pre-anesthesia Checklist: Patient identified, Emergency Drugs available, Suction available and Patient being monitored Patient Re-evaluated:Patient Re-evaluated prior to inductionOxygen Delivery Method: Circle System Utilized Preoxygenation: Pre-oxygenation with 100% oxygen Intubation Type: IV induction Ventilation: Mask ventilation without difficulty LMA: LMA inserted LMA Size: 5.0 Number of attempts: 1 Airway Equipment and Method: Bite block Placement Confirmation: positive ETCO2 Tube secured with: Tape Dental Injury: Teeth and Oropharynx as per pre-operative assessment

## 2015-07-22 NOTE — Transfer of Care (Signed)
Immediate Anesthesia Transfer of Care Note  Patient: Misty Ward  Procedure(s) Performed: Procedure(s): CYSTOSCOPY/RETROGRADE/URETEROSCOPY/STONE EXTRACTION WITH BASKET (Right) HOLMIUM LASER APPLICATION (Right) CYSTOSCOPY WITH STENT REPLACEMENT (Right)  Patient Location: PACU  Anesthesia Type:General  Level of Consciousness: awake and oriented  Airway & Oxygen Therapy: Patient Spontanous Breathing and Patient connected to nasal cannula oxygen  Post-op Assessment: Report given to RN  Post vital signs: Reviewed and stable  Last Vitals:  Filed Vitals:   07/22/15 1223  BP: 118/64  Pulse: 107  Temp: 36.8 C  Resp: 14    Complications: No apparent anesthesia complications

## 2015-07-22 NOTE — Anesthesia Postprocedure Evaluation (Signed)
  Anesthesia Post-op Note  Patient: Misty Ward  Procedure(s) Performed: Procedure(s) (LRB): CYSTOSCOPY/RETROGRADE/URETEROSCOPY/STONE EXTRACTION WITH BASKET (Right) HOLMIUM LASER APPLICATION (Right) CYSTOSCOPY WITH STENT REPLACEMENT (Right)  Patient Location: PACU  Anesthesia Type: General  Level of Consciousness: awake and alert   Airway and Oxygen Therapy: Patient Spontanous Breathing  Post-op Pain: mild  Post-op Assessment: Post-op Vital signs reviewed, Patient's Cardiovascular Status Stable, Respiratory Function Stable, Patent Airway and No signs of Nausea or vomiting  Last Vitals:  Filed Vitals:   07/22/15 1547  BP: 131/52  Pulse: 76  Temp: 36.9 C  Resp: 12    Post-op Vital Signs: stable   Complications: No apparent anesthesia complications

## 2015-07-22 NOTE — Discharge Instructions (Signed)

## 2015-07-22 NOTE — Anesthesia Preprocedure Evaluation (Addendum)
Anesthesia Evaluation  Patient identified by MRN, date of birth, ID band Patient awake    Reviewed: Allergy & Precautions, NPO status , Patient's Chart, lab work & pertinent test results  History of Anesthesia Complications Negative for: history of anesthetic complications  Airway Mallampati: II  TM Distance: >3 FB Neck ROM: Full    Dental no notable dental hx. (+) Dental Advisory Given, Partial Upper   Pulmonary former smoker,  breath sounds clear to auscultation  Pulmonary exam normal       Cardiovascular negative cardio ROS Normal cardiovascular examRhythm:Regular Rate:Normal     Neuro/Psych PSYCHIATRIC DISORDERS Anxiety negative neurological ROS     GI/Hepatic negative GI ROS, Neg liver ROS,   Endo/Other  Hypothyroidism Obesity   Renal/GU negative Renal ROS  negative genitourinary   Musculoskeletal negative musculoskeletal ROS (+)   Abdominal   Peds negative pediatric ROS (+)  Hematology negative hematology ROS (+)   Anesthesia Other Findings   Reproductive/Obstetrics negative OB ROS                            Anesthesia Physical  Anesthesia Plan  ASA: II  Anesthesia Plan: General   Post-op Pain Management:    Induction: Intravenous  Airway Management Planned: LMA  Additional Equipment:   Intra-op Plan:   Post-operative Plan: Extubation in OR  Informed Consent: I have reviewed the patients History and Physical, chart, labs and discussed the procedure including the risks, benefits and alternatives for the proposed anesthesia with the patient or authorized representative who has indicated his/her understanding and acceptance.   Dental advisory given  Plan Discussed with: CRNA  Anesthesia Plan Comments: (Pt states that had severe hip and thigh pain the day after last anesthetic.  Will position prior to going to sleep. Use LMA, no sux. )      Anesthesia Quick  Evaluation

## 2015-07-22 NOTE — Brief Op Note (Signed)
07/22/2015  2:37 PM  PATIENT:  Misty Ward  74 y.o. female  PRE-OPERATIVE DIAGNOSIS:  RIGHT URETERAL STONE  POST-OPERATIVE DIAGNOSIS:  RIGHT URETERAL STONE  PROCEDURE:  Procedure(s): CYSTOSCOPY/RETROGRADE/URETEROSCOPY/STONE EXTRACTION WITH BASKET (Right) HOLMIUM LASER APPLICATION (Right) CYSTOSCOPY WITH STENT REPLACEMENT (Right)  SURGEON:  Surgeon(s) and Role:    * Malen Gauze, MD - Primary  PHYSICIAN ASSISTANT:   ASSISTANTS: none   ANESTHESIA:   general  EBL:  Total I/O In: 1000 [I.V.:1000] Out: -   BLOOD ADMINISTERED:none  DRAINS: R 6x26 JJ ureteral stent  LOCAL MEDICATIONS USED:  NONE  SPECIMEN:  Stone for analysis  DISPOSITION OF SPECIMEN:  N/A  COUNTS:  YES  TOURNIQUET:  * No tourniquets in log *  DICTATION: .Note written in EPIC  PLAN OF CARE: Discharge to home after PACU  PATIENT DISPOSITION:  PACU - hemodynamically stable.   Delay start of Pharmacological VTE agent (>24hrs) due to surgical blood loss or risk of bleeding: not applicable

## 2015-07-22 NOTE — Op Note (Signed)
Preoperative diagnosis: Right renal stone  Postoperative diagnosis: Same  Procedure: 1 cystoscopy 2 right retrograde pyelography 3.  Intraoperative fluoroscopy, under one hour, with interpretation 4.  Right ureteroscopic stone manipulation with laser lithotripsy 5.  Right 6 x 26 JJ stent exchange  Attending: Cleda Mccreedy  Anesthesia: General  Estimated blood loss: None  Drains: Right 6 x 26 JJ ureteral stent without tether  Specimens: stone for analysis  Antibiotics: rocephin  Findings: Right 1cm UPJ stone. No hydronephrosis. No masses/lesions in the bladder. Ureteral orifices in normal anatomic location.  Indications: Patient is a 74 year old female with a history of right renal stone and who has persistent right flank pain.  After discussing treatment options, she decided proceed with right ureteroscopic stone manipulation.  Procedure her in detail: The patient was brought to the operating room and a brief timeout was done to ensure correct patient, correct procedure, correct site.  General anesthesia was administered patient was placed in dorsal lithotomy position.  Her genitalia was then prepped and draped in usual sterile fashion.  A rigid 22 French cystoscope was passed in the urethra and the bladder.  Bladder was inspected free masses or lesions.  the ureteral orifices were in the normal orthotopic locations.  USing a grapser the right ureteral stent was brought to the urethral meatus. A zip wire was advanced through the stent and up to the renal pelvis. The stent was then removed. a 6 french ureteral catheter was then instilled into the right ureter orifice.  a gentle retrograde was obtained and findings noted above.  we then removed the cystoscope and cannulated the right ureteral orifice with a semirigid ureteroscope.  No stone was found in the ureter. Once we reached the UPJ a sensor wire was advanced in to the renal pelvis. We then removed the ureteroscope and advanced an  access sheath over the sensor wire. We then used a a flexible ureteroscope to perform nephroscopy.  We encountered the stone in the renal pelvis/UPJ.  using using a 200 nm laser fiber and fragmented the stone into smaller pieces.  the pieces were then removed with a Ngage basket.  We encountered a significant amount of bleeding making visualization of the remaining fragment not possible. We removed the ureteroscope and access sheath under direct vision. we then placed a 6 x 26 double-j ureteral stent over the original zip wire. We removed the wire and good coil was noted in the the renal pelvis under fluoroscopy and the bladder under direct vision.    the bladder was then drained and this concluded the procedure which was well tolerated by patient.  Complications: None  Condition: Stable, extubated, transferred to PACU  Plan: Patient is to be discharged home as to follow-up in one week for stone extraction.

## 2015-07-23 ENCOUNTER — Encounter (HOSPITAL_BASED_OUTPATIENT_CLINIC_OR_DEPARTMENT_OTHER): Payer: Self-pay | Admitting: Urology

## 2015-07-24 ENCOUNTER — Emergency Department (HOSPITAL_COMMUNITY): Payer: Medicare Other

## 2015-07-24 ENCOUNTER — Encounter (HOSPITAL_COMMUNITY): Payer: Self-pay | Admitting: Emergency Medicine

## 2015-07-24 ENCOUNTER — Inpatient Hospital Stay (HOSPITAL_COMMUNITY)
Admission: EM | Admit: 2015-07-24 | Discharge: 2015-07-28 | DRG: 854 | Disposition: A | Discharge status: Home / Self Care | Payer: Medicare Other | Attending: Family Medicine | Admitting: Family Medicine

## 2015-07-24 DIAGNOSIS — Z87891 Personal history of nicotine dependence: Secondary | ICD-10-CM | POA: Diagnosis not present

## 2015-07-24 DIAGNOSIS — R509 Fever, unspecified: Secondary | ICD-10-CM | POA: Diagnosis not present

## 2015-07-24 DIAGNOSIS — B952 Enterococcus as the cause of diseases classified elsewhere: Secondary | ICD-10-CM | POA: Diagnosis present

## 2015-07-24 DIAGNOSIS — N201 Calculus of ureter: Secondary | ICD-10-CM | POA: Diagnosis present

## 2015-07-24 DIAGNOSIS — Z87442 Personal history of urinary calculi: Secondary | ICD-10-CM | POA: Diagnosis not present

## 2015-07-24 DIAGNOSIS — E785 Hyperlipidemia, unspecified: Secondary | ICD-10-CM | POA: Diagnosis present

## 2015-07-24 DIAGNOSIS — F4310 Post-traumatic stress disorder, unspecified: Secondary | ICD-10-CM | POA: Diagnosis present

## 2015-07-24 DIAGNOSIS — F419 Anxiety disorder, unspecified: Secondary | ICD-10-CM | POA: Diagnosis present

## 2015-07-24 DIAGNOSIS — A419 Sepsis, unspecified organism: Secondary | ICD-10-CM | POA: Diagnosis present

## 2015-07-24 DIAGNOSIS — R778 Other specified abnormalities of plasma proteins: Secondary | ICD-10-CM

## 2015-07-24 DIAGNOSIS — Z66 Do not resuscitate: Secondary | ICD-10-CM | POA: Diagnosis present

## 2015-07-24 DIAGNOSIS — B965 Pseudomonas (aeruginosa) (mallei) (pseudomallei) as the cause of diseases classified elsewhere: Secondary | ICD-10-CM | POA: Diagnosis present

## 2015-07-24 DIAGNOSIS — N390 Urinary tract infection, site not specified: Secondary | ICD-10-CM | POA: Diagnosis present

## 2015-07-24 DIAGNOSIS — N202 Calculus of kidney with calculus of ureter: Secondary | ICD-10-CM | POA: Diagnosis present

## 2015-07-24 DIAGNOSIS — N10 Acute tubulo-interstitial nephritis: Secondary | ICD-10-CM | POA: Diagnosis present

## 2015-07-24 DIAGNOSIS — R7989 Other specified abnormal findings of blood chemistry: Secondary | ICD-10-CM

## 2015-07-24 DIAGNOSIS — D72829 Elevated white blood cell count, unspecified: Secondary | ICD-10-CM

## 2015-07-24 DIAGNOSIS — E039 Hypothyroidism, unspecified: Secondary | ICD-10-CM | POA: Diagnosis present

## 2015-07-24 LAB — COMPREHENSIVE METABOLIC PANEL
ALT: 66 U/L — AB (ref 14–54)
ANION GAP: 11 (ref 5–15)
AST: 176 U/L — AB (ref 15–41)
Albumin: 3.1 g/dL — ABNORMAL LOW (ref 3.5–5.0)
Alkaline Phosphatase: 71 U/L (ref 38–126)
BILIRUBIN TOTAL: 0.7 mg/dL (ref 0.3–1.2)
BUN: 18 mg/dL (ref 6–20)
CHLORIDE: 104 mmol/L (ref 101–111)
CO2: 22 mmol/L (ref 22–32)
CREATININE: 0.88 mg/dL (ref 0.44–1.00)
Calcium: 8.3 mg/dL — ABNORMAL LOW (ref 8.9–10.3)
GFR calc Af Amer: >60 mL/min (ref >60)
Glucose, Bld: 138 mg/dL — ABNORMAL HIGH (ref 65–99)
POTASSIUM: 3.3 mmol/L — AB (ref 3.5–5.1)
SODIUM: 137 mmol/L (ref 135–145)
TOTAL PROTEIN: 6.3 g/dL — AB (ref 6.5–8.1)

## 2015-07-24 LAB — URINALYSIS, ROUTINE W REFLEX MICROSCOPIC
GLUCOSE, UA: NEGATIVE mg/dL
Ketones, ur: NEGATIVE mg/dL
NITRITE: NEGATIVE
PH: 5.5 (ref 5.0–8.0)
PROTEIN: 100 mg/dL — AB
Specific Gravity, Urine: 1.025 (ref 1.005–1.030)
Urobilinogen, UA: 0.2 mg/dL (ref 0.0–1.0)

## 2015-07-24 LAB — CBC WITH DIFFERENTIAL/PLATELET
BASOS ABS: 0 10*3/uL (ref 0.0–0.1)
Basophils Relative: 0 % (ref 0–1)
Eosinophils Absolute: 0 10*3/uL (ref 0.0–0.7)
Eosinophils Relative: 0 % (ref 0–5)
HCT: 36.9 % (ref 36.0–46.0)
Hemoglobin: 11.8 g/dL — ABNORMAL LOW (ref 12.0–15.0)
LYMPHS ABS: 1.3 10*3/uL (ref 0.7–4.0)
LYMPHS PCT: 6 % — AB (ref 12–46)
MCH: 24.8 pg — ABNORMAL LOW (ref 26.0–34.0)
MCHC: 32 g/dL (ref 30.0–36.0)
MCV: 77.7 fL — ABNORMAL LOW (ref 78.0–100.0)
MONOS PCT: 8 % (ref 3–12)
Monocytes Absolute: 1.7 10*3/uL — ABNORMAL HIGH (ref 0.1–1.0)
NEUTROS PCT: 86 % — AB (ref 43–77)
Neutro Abs: 18 10*3/uL — ABNORMAL HIGH (ref 1.7–7.7)
Platelets: 273 10*3/uL (ref 150–400)
RBC: 4.75 MIL/uL (ref 3.87–5.11)
RDW: 17.1 % — ABNORMAL HIGH (ref 11.5–15.5)
WBC: 20.9 10*3/uL — ABNORMAL HIGH (ref 4.0–10.5)

## 2015-07-24 LAB — LACTIC ACID, PLASMA
LACTIC ACID, VENOUS: 2.1 mmol/L — AB (ref 0.5–2.0)
Lactic Acid, Venous: 2 mmol/L (ref 0.5–2.0)

## 2015-07-24 LAB — URINE MICROSCOPIC-ADD ON

## 2015-07-24 LAB — LIPASE, BLOOD: Lipase: 19 U/L — ABNORMAL LOW (ref 22–51)

## 2015-07-24 LAB — TROPONIN I: TROPONIN I: 0.04 ng/mL — AB (ref <0.031)

## 2015-07-24 MED ORDER — QUETIAPINE FUMARATE 100 MG PO TABS
200.0000 mg | ORAL_TABLET | Freq: Every evening | ORAL | Status: DC
  Administered 2015-07-24 – 2015-07-27 (×4): 200 mg via ORAL
  Filled 2015-07-24 (×5): qty 2

## 2015-07-24 MED ORDER — LORAZEPAM 0.5 MG PO TABS
0.5000 mg | ORAL_TABLET | Freq: Every day | ORAL | Status: DC | PRN
  Administered 2015-07-24 – 2015-07-27 (×3): 0.5 mg via ORAL
  Filled 2015-07-24 (×3): qty 1

## 2015-07-24 MED ORDER — LEVOFLOXACIN IN D5W 750 MG/150ML IV SOLN
750.0000 mg | INTRAVENOUS | Status: DC
  Administered 2015-07-25 – 2015-07-27 (×3): 750 mg via INTRAVENOUS
  Filled 2015-07-24 (×3): qty 150

## 2015-07-24 MED ORDER — ACETAMINOPHEN 500 MG PO TABS
1000.0000 mg | ORAL_TABLET | Freq: Once | ORAL | Status: AC
  Administered 2015-07-24: 1000 mg via ORAL
  Filled 2015-07-24: qty 2

## 2015-07-24 MED ORDER — SODIUM CHLORIDE 0.9 % IV SOLN
INTRAVENOUS | Status: DC
  Administered 2015-07-24 – 2015-07-26 (×3): via INTRAVENOUS

## 2015-07-24 MED ORDER — DEXTROSE 5 % IV SOLN
2.0000 g | Freq: Once | INTRAVENOUS | Status: AC
  Administered 2015-07-24: 2 g via INTRAVENOUS
  Filled 2015-07-24: qty 2

## 2015-07-24 MED ORDER — ACETAMINOPHEN 325 MG PO TABS
650.0000 mg | ORAL_TABLET | Freq: Four times a day (QID) | ORAL | Status: DC | PRN
  Administered 2015-07-25 – 2015-07-26 (×4): 650 mg via ORAL
  Filled 2015-07-24 (×5): qty 2

## 2015-07-24 MED ORDER — LEVOTHYROXINE SODIUM 75 MCG PO TABS
75.0000 ug | ORAL_TABLET | Freq: Every day | ORAL | Status: DC
  Administered 2015-07-25 – 2015-07-28 (×4): 75 ug via ORAL
  Filled 2015-07-24 (×4): qty 1

## 2015-07-24 MED ORDER — SIMVASTATIN 20 MG PO TABS
20.0000 mg | ORAL_TABLET | Freq: Every day | ORAL | Status: DC
  Administered 2015-07-24 – 2015-07-27 (×4): 20 mg via ORAL
  Filled 2015-07-24 (×4): qty 1

## 2015-07-24 MED ORDER — SODIUM CHLORIDE 0.9 % IV SOLN
INTRAVENOUS | Status: AC
  Administered 2015-07-24: via INTRAVENOUS

## 2015-07-24 MED ORDER — ACETAMINOPHEN 650 MG RE SUPP: 650.0000 mg | Freq: Four times a day (QID) | RECTAL | Status: DC | PRN

## 2015-07-24 MED ORDER — ONDANSETRON HCL 4 MG PO TABS: 4.0000 mg | ORAL_TABLET | Freq: Four times a day (QID) | ORAL | Status: DC | PRN

## 2015-07-24 MED ORDER — CITALOPRAM HYDROBROMIDE 20 MG PO TABS
20.0000 mg | ORAL_TABLET | Freq: Every morning | ORAL | Status: DC
  Administered 2015-07-25 – 2015-07-28 (×4): 20 mg via ORAL
  Filled 2015-07-24 (×4): qty 1

## 2015-07-24 MED ORDER — ONDANSETRON HCL 4 MG/2ML IJ SOLN: 4.0000 mg | Freq: Four times a day (QID) | INTRAMUSCULAR | Status: DC | PRN

## 2015-07-24 MED ORDER — POTASSIUM CHLORIDE CRYS ER 20 MEQ PO TBCR
40.0000 meq | EXTENDED_RELEASE_TABLET | Freq: Once | ORAL | Status: AC
  Administered 2015-07-24: 40 meq via ORAL
  Filled 2015-07-24: qty 2

## 2015-07-24 MED ORDER — ENOXAPARIN SODIUM 40 MG/0.4ML ~~LOC~~ SOLN
40.0000 mg | SUBCUTANEOUS | Status: DC
  Administered 2015-07-24 – 2015-07-25 (×2): 40 mg via SUBCUTANEOUS
  Filled 2015-07-24 (×3): qty 0.4

## 2015-07-24 MED ORDER — LEVOFLOXACIN IN D5W 750 MG/150ML IV SOLN
750.0000 mg | Freq: Once | INTRAVENOUS | Status: AC
  Administered 2015-07-24: 750 mg via INTRAVENOUS
  Filled 2015-07-24: qty 150

## 2015-07-24 MED ORDER — DEXTROSE 5 % IV SOLN
1.0000 g | Freq: Three times a day (TID) | INTRAVENOUS | Status: DC
  Administered 2015-07-25 – 2015-07-28 (×11): 1 g via INTRAVENOUS
  Filled 2015-07-24 (×14): qty 1

## 2015-07-24 NOTE — H&P (Addendum)
PCP:   Isabella Stalling, MD   Chief Complaint:  Weakness, fever  HPI:  74 year old female who  has a past medical history of Anxiety; Post traumatic stress disorder (PTSD); Hyperlipidemia; Hypothyroidism; and Right ureteral stone. Portable presents to the hospital with fever and weakness. Patient stated that on Monday she had cystoscopy with stone extraction and stent placement done at urology office. Patient slept poorly yesterday and this morning when she woke up she was feeling very weak and had shivering with chills and shivering, patient came to the ED was found to have leukocytosis, fever, abnormal UA. Patient started on Azactam and Levaquin for sepsis due to UTI. Blood and urine cultures have been obtained. Patient denies chest pain, no shortness of breath. She had one episode of vomiting in the ED .  Allergies:   Allergies  Allergen Reactions  . Codeine Nausea And Vomiting  . Penicillins Rash      Past Medical History  Diagnosis Date  . Anxiety   . Post traumatic stress disorder (PTSD)   . Hyperlipidemia   . Hypothyroidism   . Right ureteral stone     Past Surgical History  Procedure Laterality Date  . Colonoscopy  09/21/2011    Procedure: COLONOSCOPY;  Surgeon: Corbin Ade, MD;  [facility];  Service: Endoscopy;  Laterality: N/A;  9:45  . Cystoscopy with stent placement Right 07/12/2015    Procedure: CYSTOSCOPY WITH STENT PLACEMENT;  Surgeon: Malen Gauze, MD;  Location: [facility];  Service: Urology;  Laterality: Right;  . Tonsillectomy  as child  . Appendectomy  as child  . Total abdominal hysterectomy w/ bilateral salpingoophorectomy  1991  . Cystoscopy/retrograde/ureteroscopy/stone extraction with basket Right 07/22/2015    Procedure: CYSTOSCOPY/RETROGRADE/URETEROSCOPY/STONE EXTRACTION WITH BASKET;  Surgeon: Malen Gauze, MD;  Location: [facility];  Service: Urology;  Laterality: Right;  . Holmium  laser application Right 07/22/2015    Procedure: HOLMIUM LASER APPLICATION;  Surgeon: Malen Gauze, MD;  Location: [facility];  Service: Urology;  Laterality: Right;  . Cystoscopy w/ ureteral stent placement Right 07/22/2015    Procedure: CYSTOSCOPY WITH STENT REPLACEMENT;  Surgeon: Malen Gauze, MD;  Location: [facility];  Service: Urology;  Laterality: Right;    Prior to Admission medications   Medication Sig Start Date End Date Taking? Authorizing Provider  acetaminophen (TYLENOL) 500 MG tablet Take 1,000 mg by mouth every 6 (six) hours as needed. For pain    Yes Historical Provider, MD  citalopram (CELEXA) 20 MG tablet Take 1 tablet by mouth every morning.  06/02/15  Yes Historical Provider, MD  ibuprofen (ADVIL,MOTRIN) 200 MG tablet Take 400 mg by mouth every 6 (six) hours as needed for moderate pain.   Yes Historical Provider, MD  levothyroxine (SYNTHROID, LEVOTHROID) 75 MCG tablet Take 75 mcg by mouth daily before breakfast.    Yes Historical Provider, MD  LORazepam (ATIVAN) 0.5 MG tablet Take 0.5 mg by mouth daily as needed for anxiety.  07/19/15  Yes Historical Provider, MD  ondansetron (ZOFRAN ODT) 4 MG disintegrating tablet 4mg  ODT q4 hours prn nausea/vomit Patient taking differently: Take 4 mg by mouth every 4 (four) hours as needed.  07/06/15  Yes Bethann Berkshire, MD  QUEtiapine (SEROQUEL) 200 MG tablet Take 1 tablet by mouth every evening.  06/02/15  Yes Historical Provider, MD  simvastatin (ZOCOR) 20 MG tablet Take 1 tablet by mouth at bedtime. 05/31/15  Yes Historical Provider, MD  tamsulosin (FLOMAX) 0.4 MG CAPS capsule Take 1 capsule (0.4 mg total) by mouth daily. 07/12/15  Yes Malen Gauze, MD  oxyCODONE-acetaminophen (PERCOCET/ROXICET) 5-325 MG per tablet Take 1 tablet by mouth every 6 (six) hours as needed. Patient not taking: Reported on 07/24/2015 07/22/15   Malen Gauze, MD    Social History:  reports that she quit smoking about  43 years ago. Her smoking use included Cigarettes. She has a 1.5 pack-year smoking history. She has never used smokeless tobacco. She reports that she does not drink alcohol or use illicit drugs.  Family History  Problem Relation Age of Onset  . Colon cancer Mother   . Pancreatic cancer Father     Ceasar Mons Weights   07/24/15 1750  Weight: 95.255 kg (210 lb)    All the positives are listed in BOLD  Review of Systems:  HEENT: Headache, blurred vision, runny nose, sore throat Neck: Hypothyroidism, hyperthyroidism,,lymphadenopathy Chest : Shortness of breath, history of COPD, Asthma Heart : Chest pain, history of coronary arterey disease GI:  Nausea, vomiting, diarrhea, constipation, GERD GU: Dysuria, urgency, frequency of urination, hematuria Neuro: Stroke, seizures, syncope Psych: Depression, anxiety, hallucinations   Physical Exam: Blood pressure 117/75, pulse 104, temperature 102.2 F (39 C), temperature source Rectal, resp. rate 22, height 5\' 7"  (1.702 m), weight 95.255 kg (210 lb), SpO2 94 %. Constitutional:   Patient is a well-developed and well-nourished *female in no acute distress and cooperative with exam. Head: Normocephalic and atraumatic Mouth: Mucus membranes moist Eyes: PERRL, EOMI, conjunctivae normal Neck: Supple, No Thyromegaly Cardiovascular: RRR, S1 normal, S2 normal Pulmonary/Chest: CTAB, no wheezes, rales, or rhonchi Abdominal: Soft. Non-tender, non-distended, bowel sounds are normal, no masses, organomegaly, or guarding present.  Neurological: A&O x3, Strength is normal and symmetric bilaterally, cranial nerve II-XII are grossly intact, no focal motor deficit, sensory intact to light touch bilaterally.  Extremities : No Cyanosis, Clubbing or Edema  Labs on Admission:  Basic Metabolic Panel:  Recent Labs Lab 07/24/15 1933  NA 137  K 3.3*  CL 104  CO2 22  GLUCOSE 138*  BUN 18  CREATININE 0.88  CALCIUM 8.3*   Liver Function Tests:  Recent  Labs Lab 07/24/15 1933  AST 176*  ALT 66*  ALKPHOS 71  BILITOT 0.7  PROT 6.3*  ALBUMIN 3.1*    Recent Labs Lab 07/24/15 1933  LIPASE 19*   No results for input(s): AMMONIA in the last 168 hours. CBC:  Recent Labs Lab 07/24/15 1933  WBC 20.9*  NEUTROABS 18.0*  HGB 11.8*  HCT 36.9  MCV 77.7*  PLT 273   Cardiac Enzymes:  Recent Labs Lab 07/24/15 1933  TROPONINI 0.04*     Radiological Exams on Admission: Dg Abd Acute W/chest  07/24/2015   CLINICAL DATA:  Nausea vomiting weakness and abdominal pain today.  EXAM: DG ABDOMEN ACUTE W/ 1V CHEST  COMPARISON:  07/12/2015  FINDINGS: There is a right ureteral stent which appears satisfactorily positioned. No conclusive ureteral calculi. The abdominal gas pattern is negative for obstruction or perforation. There is mild right hemidiaphragm elevation, unchanged from the CT of 07/06/2015. No acute cardiopulmonary findings are evident.  IMPRESSION: Right ureteral stent appears satisfactorily positioned. Unremarkable bowel gas pattern. No acute cardiopulmonary findings.   Electronically Signed   By: Ellery Plunk M.D.   On: 07/24/2015 19:34    EKG: Independently reviewed. *Sinus tachycardia   Assessment/Plan Active Problems:   Fever   Sepsis   UTI (lower urinary tract infection)  Sepsis due to UTI Patient presented with sepsis with UTI, Dr. Clarene Duke called urologist on call, recommended to start IV antibiotic. Urology will follow in a.m . follow blood cultures and urine culture results. Continue Azactam and Levaquin per pharmacy consultation.  Hypothyroidism Continue Synthroid.  Code status: DNR  Family discussion: Admission, patients condition and plan of care including tests being ordered have been discussed with the patient and her husband at bedside* who indicate understanding and agree with the plan and Code Status.   Time Spent on Admission: 60 min  Gretel Cantu S [facility] Pager: [phone] 07/24/2015,  9:07 PM  If 7PM-7AM, please contact night-coverage  [email or web address]  Password [email or web address]

## 2015-07-24 NOTE — ED Notes (Signed)
Pt assisted to restroom, extremely weak,

## 2015-07-24 NOTE — ED Notes (Signed)
Report given to Cindy RN on 300 

## 2015-07-24 NOTE — ED Notes (Signed)
CRITICAL VALUE ALERT  Critical value received: Lactic Acid  Date of notification:07/24/15  Time of notification: 1944  Critical value read back: YES  Nurse who received alert: Dellie Catholic RN   MD notified (1st page):  Clarene Duke  Time of first page:  1944  MD notified (2nd page):  Time of second page:  Responding MD:  Clarene Duke  Time MD responded:  [phone]

## 2015-07-24 NOTE — Progress Notes (Signed)
ANTIBIOTIC CONSULT NOTE-Preliminary  Pharmacy Consult for Levaquin and Aztreonam Indication: urosepsis  Allergies  Allergen Reactions  . Codeine Nausea And Vomiting  . Penicillins Rash    Patient Measurements: Height:  (170.2 cm) Weight: 210 lb (95.255 kg) IBW/kg (Calculated) : 61.6  Vital Signs: Temp: 102.2 F (39 C) (07/27 1944) Temp Source: Rectal (07/27 1944) BP: 117/75 mmHg (07/27 1958) Pulse Rate: 104 (07/27 1958)  Labs:  Recent Labs  07/24/15 1933  WBC 20.9*  HGB 11.8*  PLT 273  CREATININE 0.88    Estimated Creatinine Clearance: 68.5 mL/min (by C-G formula based on Cr of 0.88).  No results for input(s): VANCOTROUGH, VANCOPEAK, VANCORANDOM, GENTTROUGH, GENTPEAK, GENTRANDOM, TOBRATROUGH, TOBRAPEAK, TOBRARND, AMIKACINPEAK, AMIKACINTROU, AMIKACIN in the last 72 hours.   Microbiology: Recent Results (from the past 720 hour(s))  Urine culture     Status: None   Collection Time: 07/06/15  9:20 AM  Result Value Ref Range Status   Specimen Description URINE, CLEAN CATCH  Final   Special Requests NONE  Final   Culture   Final    MULTIPLE SPECIES PRESENT, SUGGEST RECOLLECTION IF CLINICALLY INDICATED Performed at [facility]    Report Status 07/08/2015 FINAL  Final  Blood culture (routine x 2)     Status: None (Preliminary result)   Collection Time: 07/24/15  6:51 PM  Result Value Ref Range Status   Specimen Description BLOOD LEFT HAND  Final   Special Requests BOTTLES DRAWN AEROBIC AND ANAEROBIC 6CC  Final   Culture PENDING  Incomplete   Report Status PENDING  Incomplete  Blood culture (routine x 2)     Status: None (Preliminary result)   Collection Time: 07/24/15  7:33 PM  Result Value Ref Range Status   Specimen Description LEFT ANTECUBITAL  Final   Special Requests BOTTLES DRAWN AEROBIC AND ANAEROBIC 6CC  Final   Culture PENDING  Incomplete   Report Status PENDING  Incomplete    Medical History: Past Medical History  Diagnosis Date  .  Anxiety   . Post traumatic stress disorder (PTSD)   . Hyperlipidemia   . Hypothyroidism   . Right ureteral stone    Anti-infectives    Start     Dose/Rate Route Frequency Ordered Stop   07/24/15 2030  levofloxacin (LEVAQUIN) IVPB 750 mg     750 mg 100 mL/hr over 90 Minutes Intravenous  Once 07/24/15 2022     07/24/15 2030  aztreonam (AZACTAM) 2 g in dextrose 5 % 50 mL IVPB     2 g 100 mL/hr over 30 Minutes Intravenous  Once 07/24/15 2022        Assessment: 74yo female s/p surgery for kidney stone on Monday.  Pt now c/o shaking and feeling sick, was told to come to ED.  Asked to start Levaquin and Aztreonam for suspected urosepsis.   Goal of Therapy:  Eradicate infection.  Plan:  Preliminary review of pertinent patient information completed.  Protocol will be initiated with a one-time dose(s) of Levaquin  and Aztreonam 2gm.  Jeani Hawking clinical pharmacist will complete review during morning rounds to assess patient and finalize treatment regimen.  Wayland Denis, [place] 07/24/2015,9:07 PM

## 2015-07-24 NOTE — ED Provider Notes (Signed)
CSN: [id number]     Arrival date & time 07/24/15  1734 History   First MD Initiated Contact with Patient 07/24/15 1756     Chief Complaint  Patient presents with  . Weakness  . Fever      HPI  Pt was seen at 1810.  Per pt and her husband, c/o gradual onset and worsening of persistent generalized weakness since yesterday. Pt states she also has been nauseated and "had chills" today. Pt states 2 days ago she had cystoscopy with stone extraction and stent placement. Denies back pain, no dysuria/hematuria, no abd pain, no vomiting/diarrhea, no CP/SOB, no rash, no focal motor weakness, no tingling/numbness in extremities.    Past Medical History  Diagnosis Date  . Anxiety   . Post traumatic stress disorder (PTSD)   . Hyperlipidemia   . Hypothyroidism   . Right ureteral stone    Past Surgical History  Procedure Laterality Date  . Colonoscopy  09/21/2011    Procedure: COLONOSCOPY;  Surgeon: Corbin Ade, MD;  [facility];  Service: Endoscopy;  Laterality: N/A;  9:45  . Cystoscopy with stent placement Right 07/12/2015    Procedure: CYSTOSCOPY WITH STENT PLACEMENT;  Surgeon: Malen Gauze, MD;  Location: [facility];  Service: Urology;  Laterality: Right;  . Tonsillectomy  as child  . Appendectomy  as child  . Total abdominal hysterectomy w/ bilateral salpingoophorectomy  1991  . Cystoscopy/retrograde/ureteroscopy/stone extraction with basket Right 07/22/2015    Procedure: CYSTOSCOPY/RETROGRADE/URETEROSCOPY/STONE EXTRACTION WITH BASKET;  Surgeon: Malen Gauze, MD;  Location: [facility]  Service: Urology;  Laterality: Right;  . Holmium laser application Right 07/22/2015    Procedure: HOLMIUM LASER APPLICATION;  Surgeon: Malen Gauze, MD;  Location: [facility];  Service: Urology;  Laterality: Right;  . Cystoscopy w/ ureteral stent placement Right 07/22/2015    Procedure: CYSTOSCOPY WITH STENT REPLACEMENT;  Surgeon:  Malen Gauze, MD;  Location: [facility];  Service: Urology;  Laterality: Right;   Family History  Problem Relation Age of Onset  . Colon cancer Mother   . Pancreatic cancer Father    History  Substance Use Topics  . Smoking status: Former Smoker -- 0.50 packs/day for 3 years    Types: Cigarettes    Quit date: 09/02/1971  . Smokeless tobacco: Never Used  . Alcohol Use: No    Review of Systems ROS: Statement: All systems negative except as marked or noted in the HPI; Constitutional: +fever and chills, generalized weakness/fatigue. ; ; Eyes: Negative for eye pain, redness and discharge. ; ; ENMT: Negative for ear pain, hoarseness, nasal congestion, sinus pressure and sore throat. ; ; Cardiovascular: Negative for chest pain, palpitations, diaphoresis, dyspnea and peripheral edema. ; ; Respiratory: Negative for cough, wheezing and stridor. ; ; Gastrointestinal: +nausea. Negative for vomiting, diarrhea, abdominal pain, blood in stool, hematemesis, jaundice and rectal bleeding. . ; ; Genitourinary: Negative for dysuria, flank pain and hematuria. ; ; Musculoskeletal: Negative for back pain and neck pain. Negative for swelling and trauma.; ; Skin: Negative for pruritus, rash, abrasions, blisters, bruising and skin lesion.; ; Neuro: Negative for headache, lightheadedness and neck stiffness. Negative for altered level of consciousness , altered mental status, extremity weakness, paresthesias, involuntary movement, seizure and syncope.      Allergies  Codeine and Penicillins  Home Medications   Prior to Admission medications   Medication Sig Start Date End Date Taking? Authorizing Provider  acetaminophen (TYLENOL) 500 MG  tablet Take 1,000 mg by mouth every 6 (six) hours as needed. For pain    Yes Historical Provider, MD  citalopram (CELEXA) 20 MG tablet Take 1 tablet by mouth every morning.  06/02/15  Yes Historical Provider, MD  ibuprofen (ADVIL,MOTRIN) 200 MG tablet Take 400  mg by mouth every 6 (six) hours as needed for moderate pain.   Yes Historical Provider, MD  levothyroxine (SYNTHROID, LEVOTHROID) 75 MCG tablet Take 75 mcg by mouth daily before breakfast.    Yes Historical Provider, MD  LORazepam (ATIVAN) 0.5 MG tablet Take 0.5 mg by mouth daily as needed for anxiety.  07/19/15  Yes Historical Provider, MD  ondansetron (ZOFRAN ODT) 4 MG disintegrating tablet  ODT q4 hours prn nausea/vomit Patient taking differently: Take 4 mg by mouth every 4 (four) hours as needed.  07/06/15  Yes Bethann Berkshire, MD  QUEtiapine (SEROQUEL) 200 MG tablet Take 1 tablet by mouth every evening.  06/02/15  Yes Historical Provider, MD  simvastatin (ZOCOR) 20 MG tablet Take 1 tablet by mouth at bedtime. 05/31/15  Yes Historical Provider, MD  tamsulosin (FLOMAX) 0.4 MG CAPS capsule Take 1 capsule (0.4 mg total) by mouth daily. 07/12/15  Yes Malen Gauze, MD  oxyCODONE-acetaminophen (PERCOCET/ROXICET) 5-325 MG per tablet Take 1 tablet by mouth every 6 (six) hours as needed. Patient not taking: Reported on 07/24/2015 07/22/15   Malen Gauze, MD   BP 117/75 mmHg  Pulse 104  Temp(Src) 102.2 F (39 C) (Rectal)  Resp 22  Ht  (1.702 m)  Wt 210 lb (95.255 kg)  BMI 32.88 kg/m2  SpO2 94%   19:45 Orthostatic Vital Signs SP  Orthostatic Lying  - BP- Lying: 117/75 mmHg ; Pulse- Lying: 104  Orthostatic Sitting - BP- Sitting: 125/112 mmHg ; Pulse- Sitting: 110  Orthostatic Standing at 0 minutes - BP- Standing at 0 minutes: 87/68 mmHg ; Pulse- Standing at 0 minutes: 126      Filed Vitals:   07/24/15 1750 07/24/15 1926 07/24/15 1944 07/24/15 1958  BP: 149/68 117/66  117/75  Pulse: 115 114  104  Temp: 100.4 F (38 C)  102.2 F (39 C)   TempSrc: Oral  Rectal   Resp: Height:  (1.702 m)     Weight: 210 lb (95.255 kg)     SpO2: 98% 96%  94%    Physical Exam  1815: Physical examination:  Nursing notes reviewed; Vital signs and O2 SAT reviewed; +febrile.;;  Constitutional: Well developed, Well nourished, Well hydrated, In no acute distress; Head:  Normocephalic, atraumatic; Eyes: EOMI, PERRL, No scleral icterus; ENMT: Mouth and pharynx normal, Mucous membranes moist; Neck: Supple, Full range of motion, No lymphadenopathy; Cardiovascular: Tachycardic rate and rhythm, No gallop; Respiratory: Breath sounds clear & equal bilaterally, No wheezes.  Speaking full sentences with ease, Normal respiratory effort/excursion; Chest: Nontender, Movement normal; Abdomen: Soft, Nontender, Nondistended, Normal bowel sounds; Genitourinary: No CVA tenderness; Extremities: Pulses normal, No tenderness, No edema, No calf edema or asymmetry.; Neuro: AA&Ox3, Major CN grossly intact.  Speech clear. No gross focal motor or sensory deficits in extremities.; Skin: Color normal, Warm, Dry.   ED Course  Procedures     EKG Interpretation   Date/Time:  Wednesday July 24 2015 18:55:48 EDT Ventricular Rate:  107 PR Interval:  175 QRS Duration: 75 QT Interval:  321 QTC Calculation: 428 R Axis:   -4 Text Interpretation:  Sinus tachycardia Low voltage, precordial leads  Borderline T abnormalities, anterior  leads No old tracing to compare  Confirmed by [facility]  MD, Nicholos Johns [phone]) on 07/24/2015 7:06:55 PM      MDM  MDM Reviewed: previous chart, nursing note and vitals Reviewed previous: labs and ECG Interpretation: labs, ECG and x-ray      Results for orders placed or performed during the hospital encounter of 07/24/15  Blood culture (routine x 2)  Result Value Ref Range   Specimen Description BLOOD LEFT HAND    Special Requests BOTTLES DRAWN AEROBIC AND ANAEROBIC 6CC    Culture PENDING    Report Status PENDING   Blood culture (routine x 2)  Result Value Ref Range   Specimen Description LEFT ANTECUBITAL    Special Requests BOTTLES DRAWN AEROBIC AND ANAEROBIC 6CC    Culture PENDING    Report Status PENDING   Lactic acid, plasma  Result Value Ref Range   Lactic  Acid, Venous 2.1 (HH) 0.5 - 2.0 mmol/L  Urinalysis, Routine w reflex microscopic (not at [facility])  Result Value Ref Range   Color, Urine YELLOW YELLOW   APPearance CLOUDY (A) CLEAR   Specific Gravity, Urine 1.025 1.005 - 1.030   pH 5.5 5.0 - 8.0   Glucose, UA NEGATIVE NEGATIVE mg/dL   Hgb urine dipstick LARGE (A) NEGATIVE   Bilirubin Urine SMALL (A) NEGATIVE   Ketones, ur NEGATIVE NEGATIVE mg/dL   Protein, ur 119 (A) NEGATIVE mg/dL   Urobilinogen, UA 0.2 0.0 - 1.0 mg/dL   Nitrite NEGATIVE NEGATIVE   Leukocytes, UA LARGE (A) NEGATIVE  CBC with Differential/Platelet  Result Value Ref Range   WBC 20.9 (H) 4.0 - 10.5 K/uL   RBC 4.75 3.87 - 5.11 MIL/uL   Hemoglobin 11.8 (L) 12.0 - 15.0 g/dL   HCT 14.7 82.9 - 56.2 %   MCV 77.7 (L) 78.0 - 100.0 fL   MCH 24.8 (L) 26.0 - 34.0 pg   MCHC 32.0 30.0 - 36.0 g/dL   RDW 13.0 (H) 86.5 - 78.4 %   Platelets 273 150 - 400 K/uL   Neutrophils Relative % 86 (H) 43 - 77 %   Neutro Abs 18.0 (H) 1.7 - 7.7 K/uL   Lymphocytes Relative 6 (L) 12 - 46 %   Lymphs Abs 1.3 0.7 - 4.0 K/uL   Monocytes Relative 8 3 - 12 %   Monocytes Absolute 1.7 (H) 0.1 - 1.0 K/uL   Eosinophils Relative 0 0 - 5 %   Eosinophils Absolute 0.0 0.0 - 0.7 K/uL   Basophils Relative 0 0 - 1 %   Basophils Absolute 0.0 0.0 - 0.1 K/uL   WBC Morphology ATYPICAL LYMPHOCYTES    RBC Morphology ELLIPTOCYTES   Comprehensive metabolic panel  Result Value Ref Range   Sodium 137 135 - 145 mmol/L   Potassium 3.3 (L) 3.5 - 5.1 mmol/L   Chloride 104 101 - 111 mmol/L   CO2 22 22 - 32 mmol/L   Glucose, Bld 138 (H) 65 - 99 mg/dL   BUN 18 6 - 20 mg/dL   Creatinine, Ser 6.96 0.44 - 1.00 mg/dL   Calcium 8.3 (L) 8.9 - 10.3 mg/dL   Total Protein 6.3 (L) 6.5 - 8.1 g/dL   Albumin 3.1 (L) 3.5 - 5.0 g/dL   AST 295 (H) 15 - 41 U/L   ALT 66 (H) 14 - 54 U/L   Alkaline Phosphatase 71 38 - 126 U/L   Total Bilirubin 0.7 0.3 - 1.2 mg/dL   GFR calc non Af Amer >60 >60 mL/min  GFR calc Af Amer >60 >60 mL/min    Anion gap 11 5 - 15  Lipase, blood  Result Value Ref Range   Lipase 19 (L) 22 - 51 U/L  Troponin I  Result Value Ref Range   Troponin I 0.04 (H) <0.031 ng/mL  Urine microscopic-add on  Result Value Ref Range   Squamous Epithelial / LPF RARE RARE   WBC, UA TOO NUMEROUS TO COUNT <3 WBC/hpf   RBC / HPF TOO NUMEROUS TO COUNT <3 RBC/hpf   Bacteria, UA MANY (A) RARE   Dg Abd Acute W/chest 07/24/2015   CLINICAL DATA:  Nausea vomiting weakness and abdominal pain today.  EXAM: DG ABDOMEN ACUTE W/ 1V CHEST  COMPARISON:  07/12/2015  FINDINGS: There is a right ureteral stent which appears satisfactorily positioned. No conclusive ureteral calculi. The abdominal gas pattern is negative for obstruction or perforation. There is mild right hemidiaphragm elevation, unchanged from the CT of 07/06/2015. No acute cardiopulmonary findings are evident.  IMPRESSION: Right ureteral stent appears satisfactorily positioned. Unremarkable bowel gas pattern. No acute cardiopulmonary findings.   Electronically Signed   By: Ellery Plunk M.D.   On: 07/24/2015 19:34    2030:  APAP give for fever. Abd remains benign. No N/V or stooling while in the ED. +orthostatic on VS; judicious IVF given. BC and UC obtained; IV abx started for UTI. Troponin elevated, EKG with NS STTW changes and pt denies CP; will need to trend. Dx and testing d/w pt and family.  Questions answered.  Verb understanding, agreeable to admit. T/C to Uro Dr. Sherron Monday, case discussed, including:  HPI, pertinent PM/SHx, VS/PE, dx testing, ED course and treatment:  No emergent need to remove stent at this time, agreeable to consult, requests to tx medically with IV abx after [facility] and UC obtained, admit to medicine service.  T/C to [facility] Dr. Sharl Ma, case discussed, including:  HPI, pertinent PM/SHx, VS/PE, dx testing, ED course and treatment:  Agreeable to admit, requests to write temporary orders, obtain tele bed to Dr. Otilio Saber service.   Samuel Jester,  DO 07/26/15 [phone]

## 2015-07-24 NOTE — ED Notes (Signed)
Dr Clarene Duke notified of temp and orthostatic vitals, additional orders given,

## 2015-07-24 NOTE — ED Notes (Signed)
Kidney stone surgery on Monday, was told she lost a lot of blood. Today pt started shaking and feeling sick and weak, vomiting. Call MD was told to come to ED

## 2015-07-25 ENCOUNTER — Encounter (HOSPITAL_COMMUNITY): Payer: Self-pay

## 2015-07-25 LAB — COMPREHENSIVE METABOLIC PANEL
ALK PHOS: 66 U/L (ref 38–126)
ALT: 63 U/L — ABNORMAL HIGH (ref 14–54)
AST: 131 U/L — AB (ref 15–41)
Albumin: 2.7 g/dL — ABNORMAL LOW (ref 3.5–5.0)
Anion gap: 6 (ref 5–15)
BUN: 14 mg/dL (ref 6–20)
CHLORIDE: 109 mmol/L (ref 101–111)
CO2: 26 mmol/L (ref 22–32)
CREATININE: 0.76 mg/dL (ref 0.44–1.00)
Calcium: 8.2 mg/dL — ABNORMAL LOW (ref 8.9–10.3)
GFR calc non Af Amer: >60 mL/min (ref >60)
Glucose, Bld: 120 mg/dL — ABNORMAL HIGH (ref 65–99)
POTASSIUM: 3.5 mmol/L (ref 3.5–5.1)
Sodium: 141 mmol/L (ref 135–145)
TOTAL PROTEIN: 5.8 g/dL — AB (ref 6.5–8.1)
Total Bilirubin: 0.6 mg/dL (ref 0.3–1.2)

## 2015-07-25 LAB — CBC
HCT: 35.5 % — ABNORMAL LOW (ref 36.0–46.0)
Hemoglobin: 11.2 g/dL — ABNORMAL LOW (ref 12.0–15.0)
MCH: 24.9 pg — ABNORMAL LOW (ref 26.0–34.0)
MCHC: 31.5 g/dL (ref 30.0–36.0)
MCV: 78.9 fL (ref 78.0–100.0)
PLATELETS: 258 10*3/uL (ref 150–400)
RBC: 4.5 MIL/uL (ref 3.87–5.11)
RDW: 17.3 % — AB (ref 11.5–15.5)
WBC: 14.9 10*3/uL — AB (ref 4.0–10.5)

## 2015-07-25 MED ORDER — AZTREONAM 1 G IJ SOLR
INTRAMUSCULAR | Status: AC
  Filled 2015-07-25: qty 1

## 2015-07-25 NOTE — Progress Notes (Signed)
Subjective: She was admitted last night with fever. She has had a urinary tract procedure and I think she probably has a urinary tract infection. She says she feels better.  Objective: Vital signs in last 24 hours: Temp:  [98 F (36.7 C)-102.2 F (39 C)] 98.6 F (37 C) (07/28 0536) Pulse Rate:  [95-115] 96 (07/28 0419) Resp:  [15-28] 20 (07/28 0419) BP: (107-149)/(51-75) 107/51 mmHg (07/28 0419) SpO2:  [93 %-98 %] 98 % (07/28 0419) Weight:  [95.255 kg (210 lb)-98.6 kg (217 lb 6 oz)] 98.6 kg (217 lb 6 oz) (07/27 2232) Weight change:  Last BM Date: 07/24/15  Intake/Output from previous day: 07/27 0701 - 07/28 0700 In: 240 [P.O.:240] Out: 300 [Urine:300]  PHYSICAL EXAM General appearance: alert, cooperative and mild distress Resp: clear to auscultation bilaterally Cardio: regular rate and rhythm, S1, S2 normal, no murmur, click, rub or gallop GI: soft, non-tender; bowel sounds normal; no masses,  no organomegaly Extremities: extremities normal, atraumatic, no cyanosis or edema  Lab Results:  Results for orders placed or performed during the hospital encounter of 07/24/15 (from the past 48 hour(s))  Lactic acid, plasma     Status: Abnormal   Collection Time: 07/24/15  6:51 PM  Result Value Ref Range   Lactic Acid, Venous 2.1 (HH) 0.5 - 2.0 mmol/L    Comment: RESULT REPEATED AND VERIFIED CRITICAL RESULT CALLED TO, READ BACK BY AND VERIFIED WITH: HASKINS,D AT 1945 ON 07/24/2015 BY ISLEY,B   Blood culture (routine x 2)     Status: None (Preliminary result)   Collection Time: 07/24/15  6:51 PM  Result Value Ref Range   Specimen Description BLOOD LEFT HAND    Special Requests BOTTLES DRAWN AEROBIC AND ANAEROBIC [place]    Culture PENDING    Report Status PENDING   Urinalysis, Routine w reflex microscopic (not at [facility])     Status: Abnormal   Collection Time: 07/24/15  7:02 PM  Result Value Ref Range   Color, Urine YELLOW YELLOW   APPearance CLOUDY (A) CLEAR   Specific Gravity,  Urine 1.025 1.005 - 1.030   pH 5.5 5.0 - 8.0   Glucose, UA NEGATIVE NEGATIVE mg/dL   Hgb urine dipstick LARGE (A) NEGATIVE   Bilirubin Urine SMALL (A) NEGATIVE   Ketones, ur NEGATIVE NEGATIVE mg/dL   Protein, ur 100 (A) NEGATIVE mg/dL   Urobilinogen, UA 0.2 0.0 - 1.0 mg/dL   Nitrite NEGATIVE NEGATIVE   Leukocytes, UA LARGE (A) NEGATIVE  Urine microscopic-add on     Status: Abnormal   Collection Time: 07/24/15  7:02 PM  Result Value Ref Range   Squamous Epithelial / LPF RARE RARE   WBC, UA TOO NUMEROUS TO COUNT <3 WBC/hpf   RBC / HPF TOO NUMEROUS TO COUNT <3 RBC/hpf   Bacteria, UA MANY (A) RARE  Blood culture (routine x 2)     Status: None (Preliminary result)   Collection Time: 07/24/15  7:33 PM  Result Value Ref Range   Specimen Description LEFT ANTECUBITAL    Special Requests BOTTLES DRAWN AEROBIC AND ANAEROBIC 6CC    Culture PENDING    Report Status PENDING   CBC with Differential/Platelet     Status: Abnormal   Collection Time: 07/24/15  7:33 PM  Result Value Ref Range   WBC 20.9 (H) 4.0 - 10.5 K/uL    Comment: RESULT REPEATED AND VERIFIED WHITE COUNT CONFIRMED ON SMEAR    RBC 4.75 3.87 - 5.11 MIL/uL   Hemoglobin 11.8 (L) 12.0 -  15.0 g/dL   HCT 36.9 36.0 - 46.0 %   MCV 77.7 (L) 78.0 - 100.0 fL   MCH 24.8 (L) 26.0 - 34.0 pg   MCHC 32.0 30.0 - 36.0 g/dL   RDW 17.1 (H) 11.5 - 15.5 %   Platelets 273 150 - 400 K/uL   Neutrophils Relative % 86 (H) 43 - 77 %   Neutro Abs 18.0 (H) 1.7 - 7.7 K/uL   Lymphocytes Relative 6 (L) 12 - 46 %   Lymphs Abs 1.3 0.7 - 4.0 K/uL   Monocytes Relative 8 3 - 12 %   Monocytes Absolute 1.7 (H) 0.1 - 1.0 K/uL   Eosinophils Relative 0 0 - 5 %   Eosinophils Absolute 0.0 0.0 - 0.7 K/uL   Basophils Relative 0 0 - 1 %   Basophils Absolute 0.0 0.0 - 0.1 K/uL   WBC Morphology ATYPICAL LYMPHOCYTES     Comment: VACUOLATED NEUTROPHILS   RBC Morphology ELLIPTOCYTES   Comprehensive metabolic panel     Status: Abnormal   Collection Time: 07/24/15   7:33 PM  Result Value Ref Range   Sodium 137 135 - 145 mmol/L   Potassium 3.3 (L) 3.5 - 5.1 mmol/L   Chloride 104 101 - 111 mmol/L   CO2 22 22 - 32 mmol/L   Glucose, Bld 138 (H) 65 - 99 mg/dL   BUN 18 6 - 20 mg/dL   Creatinine, Ser 0.88 0.44 - 1.00 mg/dL   Calcium 8.3 (L) 8.9 - 10.3 mg/dL   Total Protein 6.3 (L) 6.5 - 8.1 g/dL   Albumin 3.1 (L) 3.5 - 5.0 g/dL   AST 176 (H) 15 - 41 U/L   ALT 66 (H) 14 - 54 U/L   Alkaline Phosphatase 71 38 - 126 U/L   Total Bilirubin 0.7 0.3 - 1.2 mg/dL   GFR calc non Af Amer >60 >60 mL/min   GFR calc Af Amer >60 >60 mL/min    Comment: (NOTE) The eGFR has been calculated using the CKD EPI equation. This calculation has not been validated in all clinical situations. eGFR's persistently <60 mL/min signify possible Chronic Kidney Disease.    Anion gap 11 5 - 15  Lipase, blood     Status: Abnormal   Collection Time: 07/24/15  7:33 PM  Result Value Ref Range   Lipase 19 (L) 22 - 51 U/L  Troponin I     Status: Abnormal   Collection Time: 07/24/15  7:33 PM  Result Value Ref Range   Troponin I 0.04 (H) <0.031 ng/mL    Comment:        PERSISTENTLY INCREASED TROPONIN VALUES IN THE RANGE OF 0.04-0.49 ng/mL CAN BE SEEN IN:       -UNSTABLE ANGINA       -CONGESTIVE HEART FAILURE       -MYOCARDITIS       -CHEST TRAUMA       -ARRYHTHMIAS       -LATE PRESENTING MYOCARDIAL INFARCTION       -COPD   CLINICAL FOLLOW-UP RECOMMENDED.   Lactic acid, plasma     Status: None   Collection Time: 07/24/15  9:47 PM  Result Value Ref Range   Lactic Acid, Venous 2.0 0.5 - 2.0 mmol/L  CBC     Status: Abnormal   Collection Time: 07/25/15  6:29 AM  Result Value Ref Range   WBC 14.9 (H) 4.0 - 10.5 K/uL   RBC 4.50 3.87 - 5.11 MIL/uL   Hemoglobin 11.2 (  L) 12.0 - 15.0 g/dL   HCT 35.5 (L) 36.0 - 46.0 %   MCV 78.9 78.0 - 100.0 fL   MCH 24.9 (L) 26.0 - 34.0 pg   MCHC 31.5 30.0 - 36.0 g/dL   RDW 17.3 (H) 11.5 - 15.5 %   Platelets 258 150 - 400 K/uL  Comprehensive  metabolic panel     Status: Abnormal   Collection Time: 07/25/15  6:29 AM  Result Value Ref Range   Sodium 141 135 - 145 mmol/L   Potassium 3.5 3.5 - 5.1 mmol/L   Chloride 109 101 - 111 mmol/L   CO2 26 22 - 32 mmol/L   Glucose, Bld 120 (H) 65 - 99 mg/dL   BUN 14 6 - 20 mg/dL   Creatinine, Ser 0.76 0.44 - 1.00 mg/dL   Calcium 8.2 (L) 8.9 - 10.3 mg/dL   Total Protein 5.8 (L) 6.5 - 8.1 g/dL   Albumin 2.7 (L) 3.5 - 5.0 g/dL   AST 131 (H) 15 - 41 U/L   ALT 63 (H) 14 - 54 U/L   Alkaline Phosphatase 66 38 - 126 U/L   Total Bilirubin 0.6 0.3 - 1.2 mg/dL   GFR calc non Af Amer >60 >60 mL/min   GFR calc Af Amer >60 >60 mL/min    Comment: (NOTE) The eGFR has been calculated using the CKD EPI equation. This calculation has not been validated in all clinical situations. eGFR's persistently <60 mL/min signify possible Chronic Kidney Disease.    Anion gap 6 5 - 15    ABGS No results for input(s): PHART, PO2ART, TCO2, HCO3 in the last 72 hours.  Invalid input(s): PCO2 CULTURES Recent Results (from the past 240 hour(s))  Blood culture (routine x 2)     Status: None (Preliminary result)   Collection Time: 07/24/15  6:51 PM  Result Value Ref Range Status   Specimen Description BLOOD LEFT HAND  Final   Special Requests BOTTLES DRAWN AEROBIC AND ANAEROBIC 6CC  Final   Culture PENDING  Incomplete   Report Status PENDING  Incomplete  Blood culture (routine x 2)     Status: None (Preliminary result)   Collection Time: 07/24/15  7:33 PM  Result Value Ref Range Status   Specimen Description LEFT ANTECUBITAL  Final   Special Requests BOTTLES DRAWN AEROBIC AND ANAEROBIC 6CC  Final   Culture PENDING  Incomplete   Report Status PENDING  Incomplete   Studies/Results: Dg Abd Acute W/chest  07/24/2015   CLINICAL DATA:  Nausea vomiting weakness and abdominal pain today.  EXAM: DG ABDOMEN ACUTE W/ 1V CHEST  COMPARISON:  07/12/2015  FINDINGS: There is a right ureteral stent which appears  satisfactorily positioned. No conclusive ureteral calculi. The abdominal gas pattern is negative for obstruction or perforation. There is mild right hemidiaphragm elevation, unchanged from the CT of 07/06/2015. No acute cardiopulmonary findings are evident.  IMPRESSION: Right ureteral stent appears satisfactorily positioned. Unremarkable bowel gas pattern. No acute cardiopulmonary findings.   Electronically Signed   By: Andreas Newport M.D.   On: 07/24/2015 19:34    Medications:  Prior to Admission:  Prescriptions prior to admission  Medication Sig Dispense Refill Last Dose  . acetaminophen (TYLENOL) 500 MG tablet Take 1,000 mg by mouth every 6 (six) hours as needed. For pain    unknown  . citalopram (CELEXA) 20 MG tablet Take 1 tablet by mouth every morning.    07/24/2015 at Unknown time  . ibuprofen (ADVIL,MOTRIN) 200 MG tablet Take  400 mg by mouth every 6 (six) hours as needed for moderate pain.   unknown  . levothyroxine (SYNTHROID, LEVOTHROID) 75 MCG tablet Take 75 mcg by mouth daily before breakfast.    07/24/2015 at Unknown time  . LORazepam (ATIVAN) 0.5 MG tablet Take 0.5 mg by mouth daily as needed for anxiety.    Past Month at Unknown time  . ondansetron (ZOFRAN ODT) 4 MG disintegrating tablet 27m ODT q4 hours prn nausea/vomit (Patient taking differently: Take 4 mg by mouth every 4 (four) hours as needed. ) 12 tablet 0 07/24/2015 at Unknown time  . QUEtiapine (SEROQUEL) 200 MG tablet Take 1 tablet by mouth every evening.    07/23/2015 at Unknown time  . simvastatin (ZOCOR) 20 MG tablet Take 1 tablet by mouth at bedtime.   07/23/2015 at Unknown time  . tamsulosin (FLOMAX) 0.4 MG CAPS capsule Take 1 capsule (0.4 mg total) by mouth daily. 30 capsule 0 07/23/2015 at Unknown time  . oxyCODONE-acetaminophen (PERCOCET/ROXICET) 5-325 MG per tablet Take 1 tablet by mouth every 6 (six) hours as needed. (Patient not taking: Reported on 07/24/2015) 30 tablet 0    Scheduled: . sodium chloride    Intravenous STAT  . aztreonam  1 g Intravenous Q8H  . citalopram  20 mg Oral q morning - 10a  . enoxaparin (LOVENOX) injection  40 mg Subcutaneous Q24H  . levofloxacin (LEVAQUIN) IV  750 mg Intravenous Q24H  . levothyroxine  75 mcg Oral QAC breakfast  . QUEtiapine  200 mg Oral QPM  . simvastatin  20 mg Oral QHS   Continuous: . sodium chloride 75 mL/hr at 07/24/15 1854   PZXY:[id number]**OR** acetaminophen, LORazepam, ondansetron **OR** ondansetron (ZOFRAN) IV  Assesment: She was admitted with fever and sepsis probably lower urinary tract infection after having had urological procedure. She's better but still doesn't feel well. Active Problems:   Fever   Sepsis   UTI (lower urinary tract infection)    Plan: Continue antibiotics. Attempt to discuss with urology.    LOS: 1 day   Meril Dray L 07/25/2015, 9:25 AM

## 2015-07-25 NOTE — Care Management Note (Signed)
Case Management Note  Patient Details  Name: Misty Ward MRN: [id number] Date of Birth: May 01, 1941  Subjective/Objective:                  Pt admitted from home with sepsis. Pt lives with her husband and will return home at discharge. Pt is independent with ADL's.  Action/Plan: No CM needs noted.  Expected Discharge Date:    07/27/15              Expected Discharge Plan:  Home/Self Care  In-House Referral:  NA  Discharge planning Services  CM Consult  Post Acute Care Choice:  NA Choice offered to:  NA  DME Arranged:    DME Agency:     HH Arranged:    HH Agency:     Status of Service:  In process, will continue to follow  Medicare Important Message Given:    Date Medicare IM Given:    Medicare IM give by:    Date Additional Medicare IM Given:    Additional Medicare Important Message give by:     If discussed at Long Length of Stay Meetings, dates discussed:    Additional Comments:  Cheryl Flash, RN 07/25/2015, 11:22 AM

## 2015-07-25 NOTE — Progress Notes (Signed)
Nutrition Brief Note  Patient identified on the Malnutrition Screening Tool (MST) Report.  She presents to ED with fever and increased weakness.  Pt is eating her lunch during follow up and >75% of meal  Consumed. She endorses improved appetite and there is no evidence of  weight loss at this time. UBW reported by pt at 212#.  Wt Readings from Last 15 Encounters:  07/24/15 217 lb 6 oz (98.6 kg)  07/22/15 212 lb 8 oz (96.389 kg)  07/12/15 220 lb (99.791 kg)  07/06/15 200 lb (90.719 kg)  09/21/11 200 lb (90.719 kg)  09/02/11 200 lb 6.4 oz (90.901 kg)    Body mass index is 34.04 kg/(m^2). 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   Current diet order is regular, patient is consuming approximately 75% of meals at this time. Labs and medications reviewed.   Physical nutrition assessment: WDL  No nutrition interventions warranted at this time. If nutrition issues arise, please consult RD.   Royann Shivers MS,RD,CSG,LDN Office: [phone] Pager: [phone]

## 2015-07-25 NOTE — H&P (Signed)
Urology Admission H&P  Chief Complaint: right flank pain  History of Present Illness: Misty Ward is a 74yo with a hx of right ureteral stone who is s/p right ureteral stent placement. She presents today for right ureteroscopic stone extraction. She denies any pain or LUTS from the stent. She denies fevers/chills/sweats.  Past Medical History  Diagnosis Date  . Anxiety   . Post traumatic stress disorder (PTSD)   . Hyperlipidemia   . Hypothyroidism   . Right ureteral stone    Past Surgical History  Procedure Laterality Date  . Colonoscopy  09/21/2011    Procedure: COLONOSCOPY;  Surgeon: Corbin Ade, MD;  [facility];  Service: Endoscopy;  Laterality: N/A;  9:45  . Cystoscopy with stent placement Right 07/12/2015    Procedure: CYSTOSCOPY WITH STENT PLACEMENT;  Surgeon: Malen Gauze, MD;  Location: [facility];  Service: Urology;  Laterality: Right;  . Tonsillectomy  as child  . Appendectomy  as child  . Total abdominal hysterectomy w/ bilateral salpingoophorectomy  1991  . Cystoscopy/retrograde/ureteroscopy/stone extraction with basket Right 07/22/2015    Procedure: CYSTOSCOPY/RETROGRADE/URETEROSCOPY/STONE EXTRACTION WITH BASKET;  Surgeon: Malen Gauze, MD;  Location: [facility];  Service: Urology;  Laterality: Right;  . Holmium laser application Right 07/22/2015    Procedure: HOLMIUM LASER APPLICATION;  Surgeon: Malen Gauze, MD;  Location: [facility];  Service: Urology;  Laterality: Right;  . Cystoscopy w/ ureteral stent placement Right 07/22/2015    Procedure: CYSTOSCOPY WITH STENT REPLACEMENT;  Surgeon: Malen Gauze, MD;  Location: [facility];  Service: Urology;  Laterality: Right;    Home Medications:  Prescriptions prior to admission  Medication Sig Dispense Refill Last Dose  . acetaminophen (TYLENOL) 500 MG tablet Take 1,000 mg by mouth every 6 (six) hours as needed. For pain     unknown  . citalopram (CELEXA) 20 MG tablet Take 1 tablet by mouth every morning.    07/24/2015 at Unknown time  . ibuprofen (ADVIL,MOTRIN) 200 MG tablet Take 400 mg by mouth every 6 (six) hours as needed for moderate pain.   unknown  . levothyroxine (SYNTHROID, LEVOTHROID) 75 MCG tablet Take 75 mcg by mouth daily before breakfast.    07/24/2015 at Unknown time  . LORazepam (ATIVAN) 0.5 MG tablet Take 0.5 mg by mouth daily as needed for anxiety.    Past Month at Unknown time  . ondansetron (ZOFRAN ODT) 4 MG disintegrating tablet  ODT q4 hours prn nausea/vomit (Patient taking differently: Take 4 mg by mouth every 4 (four) hours as needed. ) 12 tablet 0 07/24/2015 at Unknown time  . QUEtiapine (SEROQUEL) 200 MG tablet Take 1 tablet by mouth every evening.    07/23/2015 at Unknown time  . simvastatin (ZOCOR) 20 MG tablet Take 1 tablet by mouth at bedtime.   07/23/2015 at Unknown time  . tamsulosin (FLOMAX) 0.4 MG CAPS capsule Take 1 capsule (0.4 mg total) by mouth daily. 30 capsule 0 07/23/2015 at Unknown time  . oxyCODONE-acetaminophen (PERCOCET/ROXICET) 5-325 MG per tablet Take 1 tablet by mouth every 6 (six) hours as needed. (Patient not taking: Reported on 07/24/2015) 30 tablet 0    Allergies:  Allergies  Allergen Reactions  . Codeine Nausea And Vomiting  . Penicillins Rash    Family History  Problem Relation Age of Onset  . Colon cancer Mother   . Pancreatic cancer Father    Social History:  reports that she quit smoking about 59  years ago. Her smoking use included Cigarettes. She has a 1.5 pack-year smoking history. She has never used smokeless tobacco. She reports that she does not drink alcohol or use illicit drugs.  Review of Systems  All other systems reviewed and are negative.   Physical Exam:  Vital signs in last 24 hours: Temp:  [98 F (36.7 C)-102.2 F (39 C)] 98.6 F (37 C) (07/28 0536) Pulse Rate:  [95-115] 96 (07/28 0419) Resp:  [15-28] 20 (07/28 0419) BP:  (107-149)/(51-75) 107/51 mmHg (07/28 0419) SpO2:  [93 %-98 %] 98 % (07/28 0419) Weight:  [95.255 kg (210 lb)-98.6 kg (217 lb 6 oz)] 98.6 kg (217 lb 6 oz) (07/27 2232) Physical Exam  Constitutional: She is oriented to person, place, and time. She appears well-developed and well-nourished.  HENT:  Head: Normocephalic and atraumatic.  Eyes: EOM are normal. Pupils are equal, round, and reactive to light.  Neck: Normal range of motion. Neck supple.  Cardiovascular: Normal rate and regular rhythm.   Respiratory: Effort normal and breath sounds normal.  GI: Soft. She exhibits no distension.  Musculoskeletal: Normal range of motion.  Neurological: She is alert and oriented to person, place, and time.  Skin: Skin is warm and dry.  Psychiatric: She has a normal mood and affect. Her behavior is normal. Judgment and thought content normal.    Laboratory Data:  No results found for this or any previous visit (from the past 24 hour(s)). Recent Results (from the past 240 hour(s))  Blood culture (routine x 2)     Status: None (Preliminary result)   Collection Time: 07/24/15  6:51 PM  Result Value Ref Range Status   Specimen Description BLOOD LEFT HAND  Final   Special Requests BOTTLES DRAWN AEROBIC AND ANAEROBIC 6CC  Final   Culture PENDING  Incomplete   Report Status PENDING  Incomplete  Blood culture (routine x 2)     Status: None (Preliminary result)   Collection Time: 07/24/15  7:33 PM  Result Value Ref Range Status   Specimen Description LEFT ANTECUBITAL  Final   Special Requests BOTTLES DRAWN AEROBIC AND ANAEROBIC [facility]  Final   Culture PENDING  Incomplete   Report Status PENDING  Incomplete   Creatinine:  Recent Labs  07/24/15 1933 07/25/15 0629  CREATININE 0.88 0.76   Impression/Assessment:  Right ureteral stone  Plan:  Risks/benefits/alternatives to right ureteroscopic stone extraction was explained to the patient and they understand and wish to proceed with  surgery  Misty Ward L 07/25/2015, 8:37 AM

## 2015-07-26 DIAGNOSIS — N10 Acute tubulo-interstitial nephritis: Secondary | ICD-10-CM | POA: Diagnosis present

## 2015-07-26 NOTE — Progress Notes (Signed)
ANTIBIOTIC CONSULT NOTE- follow up  Pharmacy Consult for Levaquin and Aztreonam Indication: urosepsis / pyelonephritis  Allergies  Allergen Reactions  . Codeine Nausea And Vomiting  . Penicillins Rash   Patient Measurements: Height:  (170.2 cm) Weight: 217 lb 6 oz (98.6 kg) IBW/kg (Calculated) : 61.6  Vital Signs: Temp: 98 F (36.7 C) (07/29 0417) Temp Source: Oral (07/29 0417) BP: 106/74 mmHg (07/29 0417) Pulse Rate: 93 (07/29 0417)  Labs:  Recent Labs  07/24/15 1933 07/25/15 0629  WBC 20.9* 14.9*  HGB 11.8* 11.2*  PLT 273 258  CREATININE 0.88 0.76    Estimated Creatinine Clearance: 76.7 mL/min (by C-G formula based on Cr of 0.76).  No results for input(s): VANCOTROUGH, VANCOPEAK, VANCORANDOM, GENTTROUGH, GENTPEAK, GENTRANDOM, TOBRATROUGH, TOBRAPEAK, TOBRARND, AMIKACINPEAK, AMIKACINTROU, AMIKACIN in the last 72 hours.   Microbiology: Recent Results (from the past 720 hour(s))  Urine culture     Status: None   Collection Time: 07/06/15  9:20 AM  Result Value Ref Range Status   Specimen Description URINE, CLEAN CATCH  Final   Special Requests NONE  Final   Culture   Final    MULTIPLE SPECIES PRESENT, SUGGEST RECOLLECTION IF CLINICALLY INDICATED Performed at [facility]    Report Status 07/08/2015 FINAL  Final  Urine culture     Status: None (Preliminary result)   Collection Time: 07/24/15  5:02 PM  Result Value Ref Range Status   Specimen Description URINE, CLEAN CATCH  Final   Special Requests NONE  Final   Culture   Final    CULTURE REINCUBATED FOR BETTER GROWTH Performed at [facility]    Report Status PENDING  Incomplete  Blood culture (routine x 2)     Status: None (Preliminary result)   Collection Time: 07/24/15  6:51 PM  Result Value Ref Range Status   Specimen Description BLOOD LEFT HAND  Final   Special Requests BOTTLES DRAWN AEROBIC AND ANAEROBIC 6CC  Final   Culture NO GROWTH 2 DAYS  Final   Report Status PENDING   Incomplete  Blood culture (routine x 2)     Status: None (Preliminary result)   Collection Time: 07/24/15  7:33 PM  Result Value Ref Range Status   Specimen Description LEFT ANTECUBITAL  Final   Special Requests BOTTLES DRAWN AEROBIC AND ANAEROBIC 6CC  Final   Culture NO GROWTH 2 DAYS  Final   Report Status PENDING  Incomplete    Medical History: Past Medical History  Diagnosis Date  . Anxiety   . Post traumatic stress disorder (PTSD)   . Hyperlipidemia   . Hypothyroidism   . Right ureteral stone    Anti-infectives    Start     Dose/Rate Route Frequency Ordered Stop   07/25/15 1800  levofloxacin (LEVAQUIN) IVPB 750 mg     750 mg 100 mL/hr over 90 Minutes Intravenous Every 24 hours 07/24/15 2233     07/25/15 0400  aztreonam (AZACTAM) 1 g in dextrose 5 % 50 mL IVPB     1 g 100 mL/hr over 30 Minutes Intravenous Every 8 hours 07/24/15 2233     07/24/15 2030  levofloxacin (LEVAQUIN) IVPB 750 mg     750 mg 100 mL/hr over 90 Minutes Intravenous  Once 07/24/15 2022 07/24/15 2248   07/24/15 2030  aztreonam (AZACTAM) 2 g in dextrose 5 % 50 mL IVPB     2 g 100 mL/hr over 30 Minutes Intravenous  Once 07/24/15 2022 07/24/15 2220  Assessment: 74yo female s/p surgery for kidney stone on Monday.  Pt now c/o shaking and feeling sick, was told to come to ED.  Asked to start Levaquin and Aztreonam for suspected urosepsis.  Currently afebrile.  Micro pending.  Goal of Therapy:  Eradicate infection.  Plan:  Continue Aztreonam 1gm IV q8h Continue Levaquin 750mg  IV q24hrs F/U c/s Monitor labs and progress Switch Levaquin to PO- deescalate when improved / appropriate Duration of therapy per MD  Valrie Hart A, RPH 07/26/2015,11:09 AM

## 2015-07-26 NOTE — Care Management Note (Signed)
Case Management Note  Patient Details  Name: Misty Ward MRN: [id number] Date of Birth: 04/30/1941  Subjective/Objective:                    Action/Plan:   Expected Discharge Date:                  Expected Discharge Plan:  Home/Self Care  In-House Referral:  NA  Discharge planning Services  CM Consult  Post Acute Care Choice:  NA Choice offered to:  NA  DME Arranged:    DME Agency:     HH Arranged:    HH Agency:     Status of Service:  Completed, signed off  Medicare Important Message Given:    Date Medicare IM Given:    Medicare IM give by:    Date Additional Medicare IM Given:    Additional Medicare Important Message give by:     If discussed at Long Length of Stay Meetings, dates discussed:    Additional Comments: Anticipate discharge within 48 hours. No CM needs noted. [name] [place], RN 07/26/2015, 11:04 AM

## 2015-07-26 NOTE — Care Management Important Message (Signed)
Important Message  Patient Details  Name: Moriyah Byington MRN: [id number] Date of Birth: 1941-10-17   Medicare Important Message Given:  Yes-second notification given    Cheryl Flash, RN 07/26/2015, 11:05 AM

## 2015-07-26 NOTE — Progress Notes (Signed)
Subjective: She says she feels a little better. She still had episodes of sweating last night but didn't have much fever. I discussed her situation with a urologist by phone and the thought is that she probably has pyelonephritis and I agree with that assessment  Objective: Vital signs in last 24 hours: Temp:  [97.8 F (36.6 C)-99.1 F (37.3 C)] 98 F (36.7 C) (07/29 0417) Pulse Rate:  [85-106] 93 (07/29 0417) Resp:  [20] 20 (07/29 0417) BP: (96-116)/(50-74) 106/74 mmHg (07/29 0417) SpO2:  [95 %-98 %] 98 % (07/29 0417) Weight change:  Last BM Date: 07/24/15  Intake/Output from previous day: 07/28 0701 - 07/29 0700 In: 5195 [P.O.:1440; I.V.:3755] Out: 500 [Urine:500]  PHYSICAL EXAM General appearance: alert, cooperative and mild distress Resp: clear to auscultation bilaterally Cardio: regular rate and rhythm, S1, S2 normal, no murmur, click, rub or gallop GI: soft, non-tender; bowel sounds normal; no masses,  no organomegaly Extremities: extremities normal, atraumatic, no cyanosis or edema  Lab Results:  Results for orders placed or performed during the hospital encounter of 07/24/15 (from the past 48 hour(s))  Lactic acid, plasma     Status: Abnormal   Collection Time: 07/24/15  6:51 PM  Result Value Ref Range   Lactic Acid, Venous 2.1 (HH) 0.5 - 2.0 mmol/L    Comment: RESULT REPEATED AND VERIFIED CRITICAL RESULT CALLED TO, READ BACK BY AND VERIFIED WITH: HASKINS,D AT 1945 ON 07/24/2015 BY ISLEY,B   Blood culture (routine x 2)     Status: None (Preliminary result)   Collection Time: 07/24/15  6:51 PM  Result Value Ref Range   Specimen Description BLOOD LEFT HAND    Special Requests BOTTLES DRAWN AEROBIC AND ANAEROBIC 6CC    Culture NO GROWTH < 24 HOURS    Report Status PENDING   Urinalysis, Routine w reflex microscopic (not at [facility])     Status: Abnormal   Collection Time: 07/24/15  7:02 PM  Result Value Ref Range   Color, Urine YELLOW YELLOW   APPearance CLOUDY (A)  CLEAR   Specific Gravity, Urine 1.025 1.005 - 1.030   pH 5.5 5.0 - 8.0   Glucose, UA NEGATIVE NEGATIVE mg/dL   Hgb urine dipstick LARGE (A) NEGATIVE   Bilirubin Urine SMALL (A) NEGATIVE   Ketones, ur NEGATIVE NEGATIVE mg/dL   Protein, ur 100 (A) NEGATIVE mg/dL   Urobilinogen, UA 0.2 0.0 - 1.0 mg/dL   Nitrite NEGATIVE NEGATIVE   Leukocytes, UA LARGE (A) NEGATIVE  Urine microscopic-add on     Status: Abnormal   Collection Time: 07/24/15  7:02 PM  Result Value Ref Range   Squamous Epithelial / LPF RARE RARE   WBC, UA TOO NUMEROUS TO COUNT <3 WBC/hpf   RBC / HPF TOO NUMEROUS TO COUNT <3 RBC/hpf   Bacteria, UA MANY (A) RARE  Blood culture (routine x 2)     Status: None (Preliminary result)   Collection Time: 07/24/15  7:33 PM  Result Value Ref Range   Specimen Description LEFT ANTECUBITAL    Special Requests BOTTLES DRAWN AEROBIC AND ANAEROBIC 6CC    Culture NO GROWTH < 24 HOURS    Report Status PENDING   CBC with Differential/Platelet     Status: Abnormal   Collection Time: 07/24/15  7:33 PM  Result Value Ref Range   WBC 20.9 (H) 4.0 - 10.5 K/uL    Comment: RESULT REPEATED AND VERIFIED WHITE COUNT CONFIRMED ON SMEAR    RBC 4.75 3.87 - 5.11 MIL/uL     Hemoglobin 11.8 (L) 12.0 - 15.0 g/dL   HCT 36.9 36.0 - 46.0 %   MCV 77.7 (L) 78.0 - 100.0 fL   MCH 24.8 (L) 26.0 - 34.0 pg   MCHC 32.0 30.0 - 36.0 g/dL   RDW 17.1 (H) 11.5 - 15.5 %   Platelets 273 150 - 400 K/uL   Neutrophils Relative % 86 (H) 43 - 77 %   Neutro Abs 18.0 (H) 1.7 - 7.7 K/uL   Lymphocytes Relative 6 (L) 12 - 46 %   Lymphs Abs 1.3 0.7 - 4.0 K/uL   Monocytes Relative 8 3 - 12 %   Monocytes Absolute 1.7 (H) 0.1 - 1.0 K/uL   Eosinophils Relative 0 0 - 5 %   Eosinophils Absolute 0.0 0.0 - 0.7 K/uL   Basophils Relative 0 0 - 1 %   Basophils Absolute 0.0 0.0 - 0.1 K/uL   WBC Morphology ATYPICAL LYMPHOCYTES     Comment: VACUOLATED NEUTROPHILS   RBC Morphology ELLIPTOCYTES   Comprehensive metabolic panel     Status:  Abnormal   Collection Time: 07/24/15  7:33 PM  Result Value Ref Range   Sodium 137 135 - 145 mmol/L   Potassium 3.3 (L) 3.5 - 5.1 mmol/L   Chloride 104 101 - 111 mmol/L   CO2 22 22 - 32 mmol/L   Glucose, Bld 138 (H) 65 - 99 mg/dL   BUN 18 6 - 20 mg/dL   Creatinine, Ser 0.88 0.44 - 1.00 mg/dL   Calcium 8.3 (L) 8.9 - 10.3 mg/dL   Total Protein 6.3 (L) 6.5 - 8.1 g/dL   Albumin 3.1 (L) 3.5 - 5.0 g/dL   AST 176 (H) 15 - 41 U/L   ALT 66 (H) 14 - 54 U/L   Alkaline Phosphatase 71 38 - 126 U/L   Total Bilirubin 0.7 0.3 - 1.2 mg/dL   GFR calc non Af Amer >60 >60 mL/min   GFR calc Af Amer >60 >60 mL/min    Comment: (NOTE) The eGFR has been calculated using the CKD EPI equation. This calculation has not been validated in all clinical situations. eGFR's persistently <60 mL/min signify possible Chronic Kidney Disease.    Anion gap 11 5 - 15  Lipase, blood     Status: Abnormal   Collection Time: 07/24/15  7:33 PM  Result Value Ref Range   Lipase 19 (L) 22 - 51 U/L  Troponin I     Status: Abnormal   Collection Time: 07/24/15  7:33 PM  Result Value Ref Range   Troponin I 0.04 (H) <0.031 ng/mL    Comment:        PERSISTENTLY INCREASED TROPONIN VALUES IN THE RANGE OF 0.04-0.49 ng/mL CAN BE SEEN IN:       -UNSTABLE ANGINA       -CONGESTIVE HEART FAILURE       -MYOCARDITIS       -CHEST TRAUMA       -ARRYHTHMIAS       -LATE PRESENTING MYOCARDIAL INFARCTION       -COPD   CLINICAL FOLLOW-UP RECOMMENDED.   Lactic acid, plasma     Status: None   Collection Time: 07/24/15  9:47 PM  Result Value Ref Range   Lactic Acid, Venous 2.0 0.5 - 2.0 mmol/L  CBC     Status: Abnormal   Collection Time: 07/25/15  6:29 AM  Result Value Ref Range   WBC 14.9 (H) 4.0 - 10.5 K/uL   RBC 4.50 3.87 - 5.11  MIL/uL   Hemoglobin 11.2 (L) 12.0 - 15.0 g/dL   HCT 35.5 (L) 36.0 - 46.0 %   MCV 78.9 78.0 - 100.0 fL   MCH 24.9 (L) 26.0 - 34.0 pg   MCHC 31.5 30.0 - 36.0 g/dL   RDW 17.3 (H) 11.5 - 15.5 %    Platelets 258 150 - 400 K/uL  Comprehensive metabolic panel     Status: Abnormal   Collection Time: 07/25/15  6:29 AM  Result Value Ref Range   Sodium 141 135 - 145 mmol/L   Potassium 3.5 3.5 - 5.1 mmol/L   Chloride 109 101 - 111 mmol/L   CO2 26 22 - 32 mmol/L   Glucose, Bld 120 (H) 65 - 99 mg/dL   BUN 14 6 - 20 mg/dL   Creatinine, Ser 0.76 0.44 - 1.00 mg/dL   Calcium 8.2 (L) 8.9 - 10.3 mg/dL   Total Protein 5.8 (L) 6.5 - 8.1 g/dL   Albumin 2.7 (L) 3.5 - 5.0 g/dL   AST 131 (H) 15 - 41 U/L   ALT 63 (H) 14 - 54 U/L   Alkaline Phosphatase 66 38 - 126 U/L   Total Bilirubin 0.6 0.3 - 1.2 mg/dL   GFR calc non Af Amer >60 >60 mL/min   GFR calc Af Amer >60 >60 mL/min    Comment: (NOTE) The eGFR has been calculated using the CKD EPI equation. This calculation has not been validated in all clinical situations. eGFR's persistently <60 mL/min signify possible Chronic Kidney Disease.    Anion gap 6 5 - 15    ABGS No results for input(s): PHART, PO2ART, TCO2, HCO3 in the last 72 hours.  Invalid input(s): PCO2 CULTURES Recent Results (from the past 240 hour(s))  Blood culture (routine x 2)     Status: None (Preliminary result)   Collection Time: 07/24/15  6:51 PM  Result Value Ref Range Status   Specimen Description BLOOD LEFT HAND  Final   Special Requests BOTTLES DRAWN AEROBIC AND ANAEROBIC 6CC  Final   Culture NO GROWTH < 24 HOURS  Final   Report Status PENDING  Incomplete  Blood culture (routine x 2)     Status: None (Preliminary result)   Collection Time: 07/24/15  7:33 PM  Result Value Ref Range Status   Specimen Description LEFT ANTECUBITAL  Final   Special Requests BOTTLES DRAWN AEROBIC AND ANAEROBIC 6CC  Final   Culture NO GROWTH < 24 HOURS  Final   Report Status PENDING  Incomplete   Studies/Results: Dg Abd Acute W/chest  07/24/2015   CLINICAL DATA:  Nausea vomiting weakness and abdominal pain today.  EXAM: DG ABDOMEN ACUTE W/ 1V CHEST  COMPARISON:  07/12/2015   FINDINGS: There is a right ureteral stent which appears satisfactorily positioned. No conclusive ureteral calculi. The abdominal gas pattern is negative for obstruction or perforation. There is mild right hemidiaphragm elevation, unchanged from the CT of 07/06/2015. No acute cardiopulmonary findings are evident.  IMPRESSION: Right ureteral stent appears satisfactorily positioned. Unremarkable bowel gas pattern. No acute cardiopulmonary findings.   Electronically Signed   By: Andreas Newport M.D.   On: 07/24/2015 19:34    Medications:  Prior to Admission:  Prescriptions prior to admission  Medication Sig Dispense Refill Last Dose  . acetaminophen (TYLENOL) 500 MG tablet Take 1,000 mg by mouth every 6 (six) hours as needed. For pain    unknown  . citalopram (CELEXA) 20 MG tablet Take 1 tablet by mouth every morning.  07/24/2015 at Unknown time  . ibuprofen (ADVIL,MOTRIN) 200 MG tablet Take 400 mg by mouth every 6 (six) hours as needed for moderate pain.   unknown  . levothyroxine (SYNTHROID, LEVOTHROID) 75 MCG tablet Take 75 mcg by mouth daily before breakfast.    07/24/2015 at Unknown time  . LORazepam (ATIVAN) 0.5 MG tablet Take 0.5 mg by mouth daily as needed for anxiety.    Past Month at Unknown time  . ondansetron (ZOFRAN ODT) 4 MG disintegrating tablet 16m ODT q4 hours prn nausea/vomit (Patient taking differently: Take 4 mg by mouth every 4 (four) hours as needed. ) 12 tablet 0 07/24/2015 at Unknown time  . QUEtiapine (SEROQUEL) 200 MG tablet Take 1 tablet by mouth every evening.    07/23/2015 at Unknown time  . simvastatin (ZOCOR) 20 MG tablet Take 1 tablet by mouth at bedtime.   07/23/2015 at Unknown time  . tamsulosin (FLOMAX) 0.4 MG CAPS capsule Take 1 capsule (0.4 mg total) by mouth daily. 30 capsule 0 07/23/2015 at Unknown time  . oxyCODONE-acetaminophen (PERCOCET/ROXICET) 5-325 MG per tablet Take 1 tablet by mouth every 6 (six) hours as needed. (Patient not taking: Reported on 07/24/2015) 30  tablet 0    Scheduled: . aztreonam  1 g Intravenous Q8H  . citalopram  20 mg Oral q morning - 10a  . enoxaparin (LOVENOX) injection  40 mg Subcutaneous Q24H  . levofloxacin (LEVAQUIN) IV  750 mg Intravenous Q24H  . levothyroxine  75 mcg Oral QAC breakfast  . QUEtiapine  200 mg Oral QPM  . simvastatin  20 mg Oral QHS   Continuous: . sodium chloride 75 mL/hr at 07/26/15 0830   PTJQ:[id number]**OR** acetaminophen, LORazepam, ondansetron **OR** ondansetron (ZOFRAN) IV  Assesment: She was admitted with pyelonephritis. She was septic on admission but seems better now. I think she is going to need at least 2 or 3 more days of IV antibiotics. She does have a stent in her ureter. Active Problems:   Fever   Sepsis   UTI (lower urinary tract infection)    Plan: Continue current treatments including IV antibiotics etc.    LOS: 2 days   Denarius Sesler L 07/26/2015, 8:49 AM

## 2015-07-27 NOTE — Progress Notes (Signed)
Subjective: She is here with urinary tract infection, probably pyelonephritis. She thinks some of her sweating episodes are related to Lovenox so she has decided not to take that. Her culture is not out jet.  Objective: Vital signs in last 24 hours: Temp:  [98.1 F (36.7 C)-98.8 F (37.1 C)] 98.7 F (37.1 C) (07/30 0707) Pulse Rate:  [76-97] 88 (07/30 0707) Resp:  [20] 20 (07/30 0707) BP: (109-133)/(57-64) 125/59 mmHg (07/30 0707) SpO2:  [97 %-98 %] 97 % (07/30 0707) Weight change:  Last BM Date: 07/25/15  Intake/Output from previous day: 07/29 0701 - 07/30 0700 In: 1483 [P.O.:480; I.V.:803; IV Piggyback:200] Out: -   PHYSICAL EXAM General appearance: alert, cooperative and no distress Resp: clear to auscultation bilaterally Cardio: regular rate and rhythm, S1, S2 normal, no murmur, click, rub or gallop GI: soft, non-tender; bowel sounds normal; no masses,  no organomegaly Extremities: Trace edema  Lab Results:  No results found for this or any previous visit (from the past 48 hour(s)).  ABGS No results for input(s): PHART, PO2ART, TCO2, HCO3 in the last 72 hours.  Invalid input(s): PCO2 CULTURES Recent Results (from the past 240 hour(s))  Urine culture     Status: None (Preliminary result)   Collection Time: 07/24/15  5:02 PM  Result Value Ref Range Status   Specimen Description URINE, CLEAN CATCH  Final   Special Requests NONE  Final   Culture   Final    CULTURE REINCUBATED FOR BETTER GROWTH Performed at [facility]    Report Status PENDING  Incomplete  Blood culture (routine x 2)     Status: None (Preliminary result)   Collection Time: 07/24/15  6:51 PM  Result Value Ref Range Status   Specimen Description BLOOD LEFT HAND  Final   Special Requests BOTTLES DRAWN AEROBIC AND ANAEROBIC 6CC  Final   Culture NO GROWTH 3 DAYS  Final   Report Status PENDING  Incomplete  Blood culture (routine x 2)     Status: None (Preliminary result)   Collection Time:  07/24/15  7:33 PM  Result Value Ref Range Status   Specimen Description LEFT ANTECUBITAL  Final   Special Requests BOTTLES DRAWN AEROBIC AND ANAEROBIC 6CC  Final   Culture NO GROWTH 3 DAYS  Final   Report Status PENDING  Incomplete   Studies/Results: No results found.  Medications:  Prior to Admission:  Prescriptions prior to admission  Medication Sig Dispense Refill Last Dose  . acetaminophen (TYLENOL) 500 MG tablet Take 1,000 mg by mouth every 6 (six) hours as needed. For pain    unknown  . citalopram (CELEXA) 20 MG tablet Take 1 tablet by mouth every morning.    07/24/2015 at Unknown time  . ibuprofen (ADVIL,MOTRIN) 200 MG tablet Take 400 mg by mouth every 6 (six) hours as needed for moderate pain.   unknown  . levothyroxine (SYNTHROID, LEVOTHROID) 75 MCG tablet Take 75 mcg by mouth daily before breakfast.    07/24/2015 at Unknown time  . LORazepam (ATIVAN) 0.5 MG tablet Take 0.5 mg by mouth daily as needed for anxiety.    Past Month at Unknown time  . ondansetron (ZOFRAN ODT) 4 MG disintegrating tablet 4mg  ODT q4 hours prn nausea/vomit (Patient taking differently: Take 4 mg by mouth every 4 (four) hours as needed. ) 12 tablet 0 07/24/2015 at Unknown time  . QUEtiapine (SEROQUEL) 200 MG tablet Take 1 tablet by mouth every evening.    07/23/2015 at Unknown time  . simvastatin (  ZOCOR) 20 MG tablet Take 1 tablet by mouth at bedtime.   07/23/2015 at Unknown time  . tamsulosin (FLOMAX) 0.4 MG CAPS capsule Take 1 capsule (0.4 mg total) by mouth daily. 30 capsule 0 07/23/2015 at Unknown time  . oxyCODONE-acetaminophen (PERCOCET/ROXICET) 5-325 MG per tablet Take 1 tablet by mouth every 6 (six) hours as needed. (Patient not taking: Reported on 07/24/2015) 30 tablet 0    Scheduled: . aztreonam  1 g Intravenous Q8H  . citalopram  20 mg Oral q morning - 10a  . enoxaparin (LOVENOX) injection  40 mg Subcutaneous Q24H  . levofloxacin (LEVAQUIN) IV  750 mg Intravenous Q24H  . levothyroxine  75 mcg Oral QAC  breakfast  . QUEtiapine  200 mg Oral QPM  . simvastatin  20 mg Oral QHS   Continuous: . sodium chloride 75 mL/hr at 07/26/15 0830   NWG:[id number] **OR** acetaminophen, LORazepam, ondansetron **OR** ondansetron (ZOFRAN) IV  Assesment: She is admitted with acute pyelonephritis and her urine culture is not out yet. She looks better. He maybe a little bit fluid overloaded. Principal Problem:   Acute pyelonephritis Active Problems:   Fever   Sepsis   UTI (lower urinary tract infection)    Plan: There is some potential that she could be discharged tomorrow if we get her culture results back and did something that can be treated orally. I'm going to discontinue her IV fluids. Continue everything else.    LOS: 3 days   Rowen Hur L 07/27/2015, 10:53 AM

## 2015-07-28 DIAGNOSIS — E785 Hyperlipidemia, unspecified: Secondary | ICD-10-CM | POA: Diagnosis present

## 2015-07-28 DIAGNOSIS — F4310 Post-traumatic stress disorder, unspecified: Secondary | ICD-10-CM | POA: Diagnosis present

## 2015-07-28 DIAGNOSIS — N201 Calculus of ureter: Secondary | ICD-10-CM | POA: Diagnosis present

## 2015-07-28 MED ORDER — CEPHALEXIN 500 MG PO CAPS: 500.0000 mg | ORAL_CAPSULE | Freq: Three times a day (TID) | ORAL | Status: DC

## 2015-07-28 MED ORDER — CIPROFLOXACIN HCL 750 MG PO TABS: 750.0000 mg | ORAL_TABLET | Freq: Two times a day (BID) | ORAL | Status: DC

## 2015-07-28 NOTE — Progress Notes (Signed)
IVs removed and WNL, pt in stable condition.  Instructed pt on medications and UTIs.  Handouts given.  Pt discharged home w/husband.

## 2015-07-28 NOTE — Progress Notes (Signed)
Subjective: She says she feels much better and is well enough to go home.  Objective: Vital signs in last 24 hours: Temp:  [98 F (36.7 C)-98.7 F (37.1 C)] 98.7 F (37.1 C) (07/31 0458) Pulse Rate:  [78-93] 83 (07/31 0458) Resp:  [20] 20 (07/31 0458) BP: (124-136)/(58-68) 127/65 mmHg (07/31 0458) SpO2:  [96 %-98 %] 96 % (07/31 0458) Weight change:  Last BM Date: 07/27/15  Intake/Output from previous day: 07/30 0701 - 07/31 0700 In: 360 [P.O.:360] Out: 300 [Urine:300]  PHYSICAL EXAM General appearance: alert, cooperative and no distress Resp: clear to auscultation bilaterally Cardio: regular rate and rhythm, S1, S2 normal, no murmur, click, rub or gallop GI: soft, non-tender; bowel sounds normal; no masses,  no organomegaly Extremities: extremities normal, atraumatic, no cyanosis or edema  Lab Results:  No results found for this or any previous visit (from the past 48 hour(s)).  ABGS No results for input(s): PHART, PO2ART, TCO2, HCO3 in the last 72 hours.  Invalid input(s): PCO2 CULTURES Recent Results (from the past 240 hour(s))  Urine culture     Status: None (Preliminary result)   Collection Time: 07/24/15  5:02 PM  Result Value Ref Range Status   Specimen Description URINE, CLEAN CATCH  Final   Special Requests NONE  Final   Culture   Final    50,000 COLONIES/mL ENTEROCOCCUS SPECIES 10,000 COLONIES/mL PSEUDOMONAS AERUGINOSA Performed at [facility]    Report Status PENDING  Incomplete  Blood culture (routine x 2)     Status: None (Preliminary result)   Collection Time: 07/24/15  6:51 PM  Result Value Ref Range Status   Specimen Description BLOOD LEFT HAND  Final   Special Requests BOTTLES DRAWN AEROBIC AND ANAEROBIC 6CC  Final   Culture NO GROWTH 4 DAYS  Final   Report Status PENDING  Incomplete  Blood culture (routine x 2)     Status: None (Preliminary result)   Collection Time: 07/24/15  7:33 PM  Result Value Ref Range Status   Specimen  Description LEFT ANTECUBITAL  Final   Special Requests BOTTLES DRAWN AEROBIC AND ANAEROBIC 6CC  Final   Culture NO GROWTH 4 DAYS  Final   Report Status PENDING  Incomplete   Studies/Results: No results found.  Medications:  Prior to Admission:  Prescriptions prior to admission  Medication Sig Dispense Refill Last Dose  . acetaminophen (TYLENOL) 500 MG tablet Take 1,000 mg by mouth every 6 (six) hours as needed. For pain    unknown  . citalopram (CELEXA) 20 MG tablet Take 1 tablet by mouth every morning.    07/24/2015 at Unknown time  . ibuprofen (ADVIL,MOTRIN) 200 MG tablet Take 400 mg by mouth every 6 (six) hours as needed for moderate pain.   unknown  . levothyroxine (SYNTHROID, LEVOTHROID) 75 MCG tablet Take 75 mcg by mouth daily before breakfast.    07/24/2015 at Unknown time  . LORazepam (ATIVAN) 0.5 MG tablet Take 0.5 mg by mouth daily as needed for anxiety.    Past Month at Unknown time  . ondansetron (ZOFRAN ODT) 4 MG disintegrating tablet  ODT q4 hours prn nausea/vomit (Patient taking differently: Take 4 mg by mouth every 4 (four) hours as needed. ) 12 tablet 0 07/24/2015 at Unknown time  . QUEtiapine (SEROQUEL) 200 MG tablet Take 1 tablet by mouth every evening.    07/23/2015 at Unknown time  . simvastatin (ZOCOR) 20 MG tablet Take 1 tablet by mouth at bedtime.   07/23/2015 at Unknown  time  . tamsulosin (FLOMAX) 0.4 MG CAPS capsule Take 1 capsule (0.4 mg total) by mouth daily. 30 capsule 0 07/23/2015 at Unknown time  . oxyCODONE-acetaminophen (PERCOCET/ROXICET) 5-325 MG per tablet Take 1 tablet by mouth every 6 (six) hours as needed. (Patient not taking: Reported on 07/24/2015) 30 tablet 0    Scheduled: . aztreonam  1 g Intravenous Q8H  . citalopram  20 mg Oral q morning - 10a  . enoxaparin (LOVENOX) injection  40 mg Subcutaneous Q24H  . levofloxacin (LEVAQUIN) IV  750 mg Intravenous Q24H  . levothyroxine  75 mcg Oral QAC breakfast  . QUEtiapine  200 mg Oral QPM  . simvastatin  20  mg Oral QHS   Continuous: . sodium chloride 10 mL/hr at 07/27/15 1046   [id number] **OR** acetaminophen, LORazepam, ondansetron **OR** ondansetron (ZOFRAN) IV  Assesment: She was admitted with acute pyelonephritis. She has fever and she was probably septic on admission. She has kidney stones and had a stent placed.  She has posttraumatic stress disorder that seems to be better.  She has hypothyroidism and that has been stable and hyperlipidemia which has not really been reassessed this admission.   Principal Problem:   Acute pyelonephritis Active Problems:   Fever   Sepsis   UTI (lower urinary tract infection)    Plan: Discharge home. Her sensitivities are not out so her antibiotics may have to be adjusted after sensitivities are released    LOS: 4 days   Jisselle Poth L 07/28/2015, 8:17 AM

## 2015-07-28 NOTE — Discharge Summary (Signed)
Physician Discharge Summary  Patient ID: Misty Ward MRN: [id number] DOB/AGE: July 18, 1941 74 y.o. Primary Care Physician:DONDIEGO,RICHARD M, MD Admit date: 07/24/2015 Discharge date: 07/28/2015    Discharge Diagnoses:   Principal Problem:   Acute pyelonephritis Active Problems:   Fever   Sepsis   UTI (lower urinary tract infection)   PTSD (post-traumatic stress disorder)   Hyperlipidemia   Right ureteral stone     Medication List    TAKE these medications        acetaminophen 500 MG tablet  Commonly known as:  TYLENOL  Take 1,000 mg by mouth every 6 (six) hours as needed. For pain     cephALEXin 500 MG capsule  Commonly known as:  KEFLEX  Take 1 capsule (500 mg total) by mouth 3 (three) times daily.     ciprofloxacin 750 MG tablet  Commonly known as:  CIPRO  Take 1 tablet (750 mg total) by mouth 2 (two) times daily.     citalopram 20 MG tablet  Commonly known as:  CELEXA  Take 1 tablet by mouth every morning.     ibuprofen 200 MG tablet  Commonly known as:  ADVIL,MOTRIN  Take 400 mg by mouth every 6 (six) hours as needed for moderate pain.     levothyroxine 75 MCG tablet  Commonly known as:  SYNTHROID, LEVOTHROID  Take 75 mcg by mouth daily before breakfast.     LORazepam 0.5 MG tablet  Commonly known as:  ATIVAN  Take 0.5 mg by mouth daily as needed for anxiety.     ondansetron 4 MG disintegrating tablet  Commonly known as:  ZOFRAN ODT  4mg  ODT q4 hours prn nausea/vomit     oxyCODONE-acetaminophen 5-325 MG per tablet  Commonly known as:  PERCOCET/ROXICET  Take 1 tablet by mouth every 6 (six) hours as needed.     QUEtiapine 200 MG tablet  Commonly known as:  SEROQUEL  Take 1 tablet by mouth every evening.     simvastatin 20 MG tablet  Commonly known as:  ZOCOR  Take 1 tablet by mouth at bedtime.     tamsulosin 0.4 MG Caps capsule  Commonly known as:  FLOMAX  Take 1 capsule (0.4 mg total) by mouth daily.        Discharged Condition:  Improved    Consults: Telephone with [facility]  Significant Diagnostic Studies: US Abdomen Complete  07/06/2015   CLINICAL DATA:  Pain  EXAM: COMPLETE ABDOMINAL ULTRASOUND  COMPARISON:  CT 07/06/2015  FINDINGS: Gallbladder: Multiple mobile gallstones, some greater than 2 cm. No gallbladder wall thickening or pericholecystic fluid.  Common bile duct:  Normal in caliber, 4.55mm diameter.  Liver: Homogeneous in echotexture without focal lesion or intrahepatic bile duct dilatation.  IVC:  Negative  Pancreas: Visualized segments unremarkable, portions obscured by overlying bowel gas.  Spleen:  No focal lesion, craniocaudal 7.0cm in length.  Right Kidney:  Mild hydronephrosis.  No mass , 11.7cm in length.  Left Kidney:  No lesion or hydronephrosis, 11.9  cm in length.  Abdominal aorta:  Negative  IMPRESSION: 1. Mild right hydronephrosis. 2. Cholelithiasis   Electronically Signed   By: Corlis Leak M.D.   On: 07/06/2015 11:35   Ct Abdomen Pelvis W Contrast  07/06/2015   CLINICAL DATA:  Patient with sudden onset abdominal pain and vomiting. Prior hysterectomy and appendectomy.  EXAM: CT ABDOMEN AND PELVIS WITH CONTRAST  TECHNIQUE: Multidetector CT imaging of the abdomen and pelvis was performed using the standard protocol following bolus  administration of intravenous contrast.  CONTRAST:  25mL OMNIPAQUE IOHEXOL 300 MG/ML SOLN, OMNIPAQUE IOHEXOL 300 MG/ML SOLN  COMPARISON:  None.  FINDINGS: Lower chest: Normal heart size. Dependent atelectasis bilateral lower lobes. No pleural effusion.  Hepatobiliary: Liver is normal in size and contour. Multiple gallstones within the gallbladder lumen without surrounding inflammatory change. No intrahepatic or extrahepatic biliary ductal dilatation.  Pancreas: Unremarkable  Spleen: Unremarkable  Adrenals/Urinary Tract: The adrenal glands are normal. There is moderate right hydronephrosis to the level of an obstructing proximal right ureteral stone which measures 11 mm.  There is delayed enhancement of the right kidney. Fat stranding about the proximal right ureter, right collecting system and right kidney. The distal right ureter is decompressed. Delayed images demonstrate no excretion of contrast into the right renal collecting system. Urinary bladder is unremarkable.  Stomach/Bowel: Sigmoid colonic diverticulosis. No CT evidence for acute diverticulitis. No abnormal bowel wall thickening or evidence for bowel obstruction.  Vascular/Lymphatic: Normal caliber abdominal aorta. No retroperitoneal lymphadenopathy.  Other: None  Musculoskeletal: Lower thoracic and lumbar spine degenerative changes.  IMPRESSION: There is an 11 mm stone within the proximal right ureter which results in high grade obstruction with moderate to severe right hydroureteronephrosis. There is delayed enhancement of the right kidney and no excretion of contrast on delayed images.  Cholelithiasis without CT evidence to suggest acute cholecystitis.  Diverticulosis.  No CT evidence for acute diverticulitis.   Electronically Signed   By: Annia Belt M.D.   On: 07/06/2015 11:15   Dg Abd Acute W/chest  07/24/2015   CLINICAL DATA:  Nausea vomiting weakness and abdominal pain today.  EXAM: DG ABDOMEN ACUTE W/ 1V CHEST  COMPARISON:  07/12/2015  FINDINGS: There is a right ureteral stent which appears satisfactorily positioned. No conclusive ureteral calculi. The abdominal gas pattern is negative for obstruction or perforation. There is mild right hemidiaphragm elevation, unchanged from the CT of 07/06/2015. No acute cardiopulmonary findings are evident.  IMPRESSION: Right ureteral stent appears satisfactorily positioned. Unremarkable bowel gas pattern. No acute cardiopulmonary findings.   Electronically Signed   By: Ellery Plunk M.D.   On: 07/24/2015 19:34    Lab Results: Basic Metabolic Panel: No results for input(s): NA, K, CL, CO2, GLUCOSE, BUN, CREATININE, CALCIUM, MG, PHOS in the last 72 hours. Liver  Function Tests: No results for input(s): AST, ALT, ALKPHOS, BILITOT, PROT, ALBUMIN in the last 72 hours.   CBC: No results for input(s): WBC, NEUTROABS, HGB, HCT, MCV, PLT in the last 72 hours.  Recent Results (from the past 240 hour(s))  Urine culture     Status: None (Preliminary result)   Collection Time: 07/24/15  5:02 PM  Result Value Ref Range Status   Specimen Description URINE, CLEAN CATCH  Final   Special Requests NONE  Final   Culture   Final    50,000 COLONIES/mL ENTEROCOCCUS SPECIES 10,000 COLONIES/mL PSEUDOMONAS AERUGINOSA Performed at [facility]    Report Status PENDING  Incomplete  Blood culture (routine x 2)     Status: None (Preliminary result)   Collection Time: 07/24/15  6:51 PM  Result Value Ref Range Status   Specimen Description BLOOD LEFT HAND  Final   Special Requests BOTTLES DRAWN AEROBIC AND ANAEROBIC 6CC  Final   Culture NO GROWTH 4 DAYS  Final   Report Status PENDING  Incomplete  Blood culture (routine x 2)     Status: None (Preliminary result)   Collection Time: 07/24/15  7:33 PM  Result Value  Ref Range Status   Specimen Description LEFT ANTECUBITAL  Final   Special Requests BOTTLES DRAWN AEROBIC AND ANAEROBIC 6CC  Final   Culture NO GROWTH 4 DAYS  Final   Report Status PENDING  Incomplete     Hospital Course: This is a 74 year old who had stent placement in her right ureter is of a kidney stone. She initially did well then about 24 hours after the procedure started having fever and chills. This eventually got to the point that she came to the emergency department where she was noted to have fever to 102. She was admitted with the thought that she had pyelonephritis probably related to her stent. She was started on IV antibiotics and improved. Over the next several days she got better and eventually was at the point that she was ready for discharge. Her urine grew 2 organisms, enterococcus and Pseudomonas so she is going to need to antibiotics  until the sensitivities are out  Discharge Exam: Blood pressure 127/65, pulse 83, temperature 98.7 F (37.1 C), temperature source Oral, resp. rate 20, height 5\' 7"  (1.702 m), weight 98.6 kg (217 lb 6 oz), SpO2 96 %. She is awake and alert. She looks very comfortable. She is afebrile. Her chest is clear  Disposition: Home she will be on Keflex and Cipro. She is noted to develop a rash with penicillin but thinks she's taken something like Keflex before and I counseled her extensively on the side effects and if she gets a rash she will discontinue her medications      Discharge Instructions    Discharge patient    Complete by:  As directed              Signed: Caleigh Rabelo L   07/28/2015, 8:23 AM

## 2015-07-29 LAB — URINE CULTURE: Culture: 50000

## 2015-07-29 LAB — CULTURE, BLOOD (ROUTINE X 2)
Culture: NO GROWTH
Culture: NO GROWTH

## 2015-09-05 ENCOUNTER — Other Ambulatory Visit: Payer: Self-pay | Admitting: Urology

## 2015-10-01 ENCOUNTER — Encounter (HOSPITAL_BASED_OUTPATIENT_CLINIC_OR_DEPARTMENT_OTHER): Payer: Self-pay | Admitting: *Deleted

## 2015-10-01 NOTE — Progress Notes (Signed)
NPO AFTER MN.  ARRIVE AT 1115.  NEEDS ISTAT 8.  WILL TAKE AM MEDS AND ATIVAN AM DOS W/ SIPS OF WATER.

## 2015-10-07 ENCOUNTER — Encounter (HOSPITAL_BASED_OUTPATIENT_CLINIC_OR_DEPARTMENT_OTHER): Payer: Self-pay | Admitting: *Deleted

## 2015-10-07 ENCOUNTER — Ambulatory Visit (HOSPITAL_BASED_OUTPATIENT_CLINIC_OR_DEPARTMENT_OTHER): Payer: Medicare Other | Admitting: Anesthesiology

## 2015-10-07 ENCOUNTER — Encounter (HOSPITAL_BASED_OUTPATIENT_CLINIC_OR_DEPARTMENT_OTHER)
Admission: RE | Disposition: A | Discharge status: Home / Self Care | Payer: Self-pay | Source: Ambulatory Visit | Attending: Urology

## 2015-10-07 ENCOUNTER — Ambulatory Visit (HOSPITAL_BASED_OUTPATIENT_CLINIC_OR_DEPARTMENT_OTHER)
Admission: RE | Admit: 2015-10-07 | Discharge: 2015-10-07 | Disposition: A | Discharge status: Home / Self Care | Payer: Medicare Other | Source: Ambulatory Visit | Attending: Urology | Admitting: Urology

## 2015-10-07 DIAGNOSIS — Z87891 Personal history of nicotine dependence: Secondary | ICD-10-CM | POA: Insufficient documentation

## 2015-10-07 DIAGNOSIS — R109 Unspecified abdominal pain: Secondary | ICD-10-CM | POA: Diagnosis present

## 2015-10-07 DIAGNOSIS — Z6831 Body mass index (BMI) 31.0-31.9, adult: Secondary | ICD-10-CM | POA: Insufficient documentation

## 2015-10-07 DIAGNOSIS — E039 Hypothyroidism, unspecified: Secondary | ICD-10-CM | POA: Diagnosis not present

## 2015-10-07 DIAGNOSIS — Z791 Long term (current) use of non-steroidal anti-inflammatories (NSAID): Secondary | ICD-10-CM | POA: Diagnosis not present

## 2015-10-07 DIAGNOSIS — E669 Obesity, unspecified: Secondary | ICD-10-CM | POA: Insufficient documentation

## 2015-10-07 DIAGNOSIS — Z87442 Personal history of urinary calculi: Secondary | ICD-10-CM | POA: Insufficient documentation

## 2015-10-07 DIAGNOSIS — E785 Hyperlipidemia, unspecified: Secondary | ICD-10-CM | POA: Insufficient documentation

## 2015-10-07 DIAGNOSIS — N200 Calculus of kidney: Secondary | ICD-10-CM | POA: Insufficient documentation

## 2015-10-07 DIAGNOSIS — Z79899 Other long term (current) drug therapy: Secondary | ICD-10-CM | POA: Insufficient documentation

## 2015-10-07 HISTORY — PX: HOLMIUM LASER APPLICATION: SHX5852

## 2015-10-07 HISTORY — PX: CYSTOSCOPY WITH RETROGRADE PYELOGRAM, URETEROSCOPY AND STENT PLACEMENT: SHX5789

## 2015-10-07 LAB — POCT I-STAT, CHEM 8
BUN: 13 mg/dL (ref 6–20)
CALCIUM ION: 1.29 mmol/L (ref 1.13–1.30)
CHLORIDE: 104 mmol/L (ref 101–111)
Creatinine, Ser: 0.7 mg/dL (ref 0.44–1.00)
Glucose, Bld: 93 mg/dL (ref 65–99)
HCT: 41 % (ref 36.0–46.0)
HEMOGLOBIN: 13.9 g/dL (ref 12.0–15.0)
Potassium: 4.9 mmol/L (ref 3.5–5.1)
SODIUM: 143 mmol/L (ref 135–145)
TCO2: 30 mmol/L (ref 0–100)

## 2015-10-07 SURGERY — CYSTOURETEROSCOPY, WITH RETROGRADE PYELOGRAM AND STENT INSERTION
Anesthesia: General | Laterality: Right

## 2015-10-07 MED ORDER — LACTATED RINGERS IV SOLN
INTRAVENOUS | Status: DC
  Administered 2015-10-07: 12:00:00 via INTRAVENOUS
  Filled 2015-10-07: qty 1000

## 2015-10-07 MED ORDER — AMPICILLIN-SULBACTAM SODIUM 1.5 (1-0.5) G IJ SOLR
INTRAMUSCULAR | Status: AC
  Filled 2015-10-07: qty 1.5

## 2015-10-07 MED ORDER — ONDANSETRON HCL 4 MG/2ML IJ SOLN
INTRAMUSCULAR | Status: DC | PRN
  Administered 2015-10-07: 4 mg via INTRAVENOUS

## 2015-10-07 MED ORDER — MIDAZOLAM HCL 2 MG/2ML IJ SOLN
INTRAMUSCULAR | Status: AC
  Filled 2015-10-07: qty 2

## 2015-10-07 MED ORDER — SODIUM CHLORIDE 0.9 % IR SOLN
Status: DC | PRN
  Administered 2015-10-07: 4000 mL via INTRAVESICAL

## 2015-10-07 MED ORDER — ONDANSETRON 4 MG PO TBDP: 4.0000 mg | ORAL_TABLET | Freq: Three times a day (TID) | ORAL | Status: AC | PRN

## 2015-10-07 MED ORDER — ONDANSETRON HCL 4 MG/2ML IJ SOLN
4.0000 mg | Freq: Once | INTRAMUSCULAR | Status: DC | PRN
  Filled 2015-10-07: qty 2

## 2015-10-07 MED ORDER — KETOROLAC TROMETHAMINE 30 MG/ML IJ SOLN
INTRAMUSCULAR | Status: DC | PRN
  Administered 2015-10-07: 15 mg via INTRAVENOUS

## 2015-10-07 MED ORDER — PROPOFOL 10 MG/ML IV BOLUS
INTRAVENOUS | Status: DC | PRN
  Administered 2015-10-07: 150 mg via INTRAVENOUS

## 2015-10-07 MED ORDER — GENTAMICIN IN SALINE 1.6-0.9 MG/ML-% IV SOLN
80.0000 mg | INTRAVENOUS | Status: DC
  Administered 2015-10-07: 380 mg via INTRAVENOUS
  Filled 2015-10-07: qty 50

## 2015-10-07 MED ORDER — SODIUM CHLORIDE 0.9 % IV SOLN
1.5000 g | INTRAVENOUS | Status: AC
  Administered 2015-10-07: 1.5 g via INTRAVENOUS
  Filled 2015-10-07: qty 1.5

## 2015-10-07 MED ORDER — GENTAMICIN SULFATE 40 MG/ML IJ SOLN
380.0000 mg | Freq: Once | INTRAVENOUS | Status: DC
  Filled 2015-10-07: qty 9.5

## 2015-10-07 MED ORDER — IOHEXOL 350 MG/ML SOLN
INTRAVENOUS | Status: DC | PRN
  Administered 2015-10-07: 2 mL via URETHRAL

## 2015-10-07 MED ORDER — SODIUM CHLORIDE 0.9 % IV SOLN
INTRAVENOUS | Status: AC
  Filled 2015-10-07: qty 50

## 2015-10-07 MED ORDER — SULFAMETHOXAZOLE-TRIMETHOPRIM 800-160 MG PO TABS: 1.0000 | ORAL_TABLET | Freq: Two times a day (BID) | ORAL | Status: AC

## 2015-10-07 MED ORDER — LIDOCAINE HCL (CARDIAC) 20 MG/ML IV SOLN
INTRAVENOUS | Status: DC | PRN
  Administered 2015-10-07: 60 mg via INTRAVENOUS

## 2015-10-07 MED ORDER — ACETAMINOPHEN 10 MG/ML IV SOLN
INTRAVENOUS | Status: DC | PRN
  Administered 2015-10-07: 1000 mg via INTRAVENOUS

## 2015-10-07 MED ORDER — DEXAMETHASONE SODIUM PHOSPHATE 4 MG/ML IJ SOLN
INTRAMUSCULAR | Status: DC | PRN
  Administered 2015-10-07: 5 mg via INTRAVENOUS

## 2015-10-07 MED ORDER — FENTANYL CITRATE (PF) 100 MCG/2ML IJ SOLN
INTRAMUSCULAR | Status: AC
  Filled 2015-10-07: qty 4

## 2015-10-07 MED ORDER — 0.9 % SODIUM CHLORIDE (POUR BTL) OPTIME
TOPICAL | Status: DC | PRN
  Administered 2015-10-07: 500 mL

## 2015-10-07 MED ORDER — FENTANYL CITRATE (PF) 100 MCG/2ML IJ SOLN
INTRAMUSCULAR | Status: DC | PRN
  Administered 2015-10-07: 25 ug via INTRAVENOUS
  Administered 2015-10-07: 50 ug via INTRAVENOUS

## 2015-10-07 MED ORDER — HYDROCODONE-ACETAMINOPHEN 5-325 MG PO TABS
1.0000 | ORAL_TABLET | Freq: Four times a day (QID) | ORAL | Status: AC | PRN
Start: 2015-10-07 — End: ?

## 2015-10-07 MED ORDER — PHENYLEPHRINE HCL 10 MG/ML IJ SOLN
INTRAMUSCULAR | Status: DC | PRN
  Administered 2015-10-07 (×3): 40 ug via INTRAVENOUS

## 2015-10-07 MED ORDER — FENTANYL CITRATE (PF) 100 MCG/2ML IJ SOLN
25.0000 ug | INTRAMUSCULAR | Status: DC | PRN
  Filled 2015-10-07: qty 1

## 2015-10-07 SURGICAL SUPPLY — 28 items
BAG URO CATCHER STRL LF (DRAPE) ×2 IMPLANT
BASKET LASER NITINOL 1.9FR (BASKET) IMPLANT
BASKET STONE 1.7 NGAGE (UROLOGICAL SUPPLIES) ×2 IMPLANT
BASKET ZERO TIP NITINOL 2.4FR (BASKET) IMPLANT
CANISTER SUCT LVC 12 LTR MEDI- (MISCELLANEOUS) IMPLANT
CATH INTERMIT  6FR 70CM (CATHETERS) IMPLANT
CLOTH BEACON ORANGE TIMEOUT ST (SAFETY) ×2 IMPLANT
FIBER LASER FLEXIVA 365 (UROLOGICAL SUPPLIES) IMPLANT
FIBER LASER TRAC TIP (UROLOGICAL SUPPLIES) ×2 IMPLANT
GLOVE BIO SURGEON STRL SZ 6.5 (GLOVE) ×2 IMPLANT
GLOVE BIO SURGEON STRL SZ8 (GLOVE) ×2 IMPLANT
GLOVE INDICATOR 6.5 STRL GRN (GLOVE) ×4 IMPLANT
GOWN STRL REUS W/ TWL LRG LVL3 (GOWN DISPOSABLE) ×1 IMPLANT
GOWN STRL REUS W/ TWL XL LVL3 (GOWN DISPOSABLE) ×1 IMPLANT
GOWN STRL REUS W/TWL LRG LVL3 (GOWN DISPOSABLE) ×1
GOWN STRL REUS W/TWL XL LVL3 (GOWN DISPOSABLE) ×1
GUIDEWIRE ANG ZIPWIRE 038X150 (WIRE) ×2 IMPLANT
GUIDEWIRE STR DUAL SENSOR (WIRE) ×2 IMPLANT
IV NS 1000ML (IV SOLUTION) ×1
IV NS 1000ML BAXH (IV SOLUTION) ×1 IMPLANT
IV NS IRRIG 3000ML ARTHROMATIC (IV SOLUTION) ×2 IMPLANT
MANIFOLD NEPTUNE II (INSTRUMENTS) ×2 IMPLANT
NS IRRIG 500ML POUR BTL (IV SOLUTION) ×2 IMPLANT
PACK CYSTO (CUSTOM PROCEDURE TRAY) ×2 IMPLANT
SHEATH ACCESS URETERAL 38CM (SHEATH) ×2 IMPLANT
STENT URET 6FRX26 CONTOUR (STENTS) ×2 IMPLANT
SYRINGE 10CC LL (SYRINGE) ×2 IMPLANT
TUBE FEEDING 8FR 16IN STR KANG (MISCELLANEOUS) IMPLANT

## 2015-10-07 NOTE — Op Note (Signed)
Preoperative diagnosis: Right renal stone  Postoperative diagnosis: Same  Procedure: 1 cystoscopy 2 right retrograde pyelography 3.  Intraoperative fluoroscopy, under one hour, with interpretation 4.  Right ureteroscopic stone manipulation with laser lithotripsy 5.  Right 6 x 26 JJ stent exchange  Attending: Cleda Mccreedy  Anesthesia: General  Estimated blood loss: None  Drains: Right 6 x 26 JJ ureteral stent with tether  Specimens: stone  Antibiotics: Unisyn and gent  Findings: Right UPJ stone. No hydronephrosis. No masses/lesions in the bladder. Ureteral orifices in normal anatomic location.  Indications: Patient is a 74 year old female/female with a history of right renal stone and who has persistent right flank pain.  After discussing treatment options, she decided proceed with right ureteroscopic stone manipulation.  Procedure her in detail: The patient was brought to the operating room and a brief timeout was done to ensure correct patient, correct procedure, correct site.  General anesthesia was administered patient was placed in dorsal lithotomy position.  Her genitalia was then prepped and draped in usual sterile fashion.  A rigid 22 French cystoscope was passed in the urethra and the bladder.  Bladder was inspected free masses or lesions.  the right ureteral orifices were in the normal orthotopic locations. Using a grasper the right ureteral stent was brought to the urethral meatus. A zip wire was then advanced though the stent and up to the renal pelvis. The stent was then removed. a 6 french ureteral catheter was then instilled into the right ureter orifice.  a gentle retrograde was obtained and findings noted above.  we then removed the cystoscope and cannulated the right ureteral orifice with a semirigid ureteroscope.  No stone was found in the ureter. Once we reached the UPJ a sensor wire was advanced in to the renal pelvis. Over the sensor wire a 12/14 x 38cm access sheath  was advanced up to the UPJ. We then used flexible ureteroscope to perform nephroscopy. We encountered a stone in the renal pelvis/UPJ and numerous in the mid pole.  Using a 200 nm laser fiber and fragmented the stone into smaller pieces.  the pieces were then removed with a Ngage basket.  once all stone fragments were removed we then removed the access sheath under direct vision. We noted no injury or tears in the ureter. We then placed a 6 x 26 double-j ureteral stent over the original zip wire.  We then removed the wire and good coil was noted in the the renal pelvis under fluoroscopy and the bladder under direct vision.    the bladder was then drained and this concluded the procedure which was well tolerated by patient.  Complications: None  Condition: Stable, extubated, transferred to PACU  Plan: Patient is to be discharged home as to follow-up in one week. She is to remove her stent in 72 hours

## 2015-10-07 NOTE — Transfer of Care (Signed)
Last Vitals:  Filed Vitals:   10/07/15 1035  BP: 132/77  Pulse: 90  Temp: 36.9 C  Resp: 16   Immediate Anesthesia Transfer of Care Note  Patient: Misty Ward  Procedure(s) Performed: Procedure(s) (LRB): CYSTOSCOPY/RETROGRADE/STONE EXTRACTION WITH BASKET AND STENT EXCHANGE (Right) HOLMIUM LASER APPLICATION (Right)  Patient Location: PACU  Anesthesia Type: General  Level of Consciousness: awake, alert  and oriented  Airway & Oxygen Therapy: Patient Spontanous Breathing and Patient connected to face mask oxygen  Post-op Assessment: Report given to PACU RN and Post -op Vital signs reviewed and stable  Post vital signs: Reviewed and stable  Complications: No apparent anesthesia complications

## 2015-10-07 NOTE — Anesthesia Procedure Notes (Signed)
Procedure Name: LMA Insertion Date/Time: 10/07/2015 1:12 PM Performed by: Norva Pavlov Pre-anesthesia Checklist: Patient identified, Emergency Drugs available, Suction available and Patient being monitored Patient Re-evaluated:Patient Re-evaluated prior to inductionOxygen Delivery Method: Circle System Utilized Preoxygenation: Pre-oxygenation with 100% oxygen Intubation Type: IV induction Ventilation: Mask ventilation without difficulty LMA: LMA inserted LMA Size: 4.0 Number of attempts: 1 Airway Equipment and Method: bite block Placement Confirmation: positive ETCO2 Tube secured with: Tape Dental Injury: Teeth and Oropharynx as per pre-operative assessment

## 2015-10-07 NOTE — H&P (Signed)
Urology Admission H&P  Chief Complaint: right flank pain  History of Present Illness: Misty Ward is a 74yo with a hx of right renal stone s/p stent, then R URS and she has residual stone fragments on Ct scan. She has intermittent hematuria. She had psuedomonas UTI which has been treated.  Past Medical History  Diagnosis Date  . Anxiety   . Post traumatic stress disorder (PTSD)   . Hyperlipidemia   . Hypothyroidism   . Right ureteral stone    Past Surgical History  Procedure Laterality Date  . Colonoscopy  09/21/2011    Procedure: COLONOSCOPY;  Surgeon: Corbin Ade, MD;  Location[facility];  Service: Endoscopy;  Laterality: N/A;  9:45  . Cystoscopy with stent placement Right 07/12/2015    Procedure: CYSTOSCOPY WITH STENT PLACEMENT;  Surgeon: Malen Gauze, MD;  Location[facility];  Service: Urology;  Laterality: Right;  . Tonsillectomy  as child  . Appendectomy  as child  . Total abdominal hysterectomy w/ bilateral salpingoophorectomy  1991  . Cystoscopy/retrograde/ureteroscopy/stone extraction with basket Right 07/22/2015    Procedure: CYSTOSCOPY/RETROGRADE/URETEROSCOPY/STONE EXTRACTION WITH BASKET;  Surgeon: Malen Gauze, MD;  Location: [facility];  Service: Urology;  Laterality: Right;  . Holmium laser application Right 07/22/2015    Procedure: HOLMIUM LASER APPLICATION;  Surgeon: Malen Gauze, MD;  Location: [facility];  Service: Urology;  Laterality: Right;  . Cystoscopy w/ ureteral stent placement Right 07/22/2015    Procedure: CYSTOSCOPY WITH STENT REPLACEMENT;  Surgeon: Malen Gauze, MD;  Location: [facility];  Service: Urology;  Laterality: Right;    Home Medications:  Prescriptions prior to admission  Medication Sig Dispense Refill Last Dose  . acetaminophen (TYLENOL) 500 MG tablet Take 1,000 mg by mouth every 6 (six) hours as needed. For pain    Past Month at Unknown time  .  Acetaminophen-Aspirin Buffered (EXCEDRIN BACK & BODY) 250-250 MG tablet Take 1 tablet by mouth every 4 (four) hours as needed for fever.   Past Month at Unknown time  . citalopram (CELEXA) 20 MG tablet Take 1 tablet by mouth every morning.    10/07/2015 at 0800  . ibuprofen (ADVIL,MOTRIN) 200 MG tablet Take 400 mg by mouth every 6 (six) hours as needed for moderate pain.   Past Month at Unknown time  . levothyroxine (SYNTHROID, LEVOTHROID) 75 MCG tablet Take 75 mcg by mouth daily before breakfast.    10/07/2015 at 0800  . LORazepam (ATIVAN) 0.5 MG tablet Take 0.5 mg by mouth daily as needed for anxiety.    10/07/2015 at 0800  . QUEtiapine (SEROQUEL) 200 MG tablet Take 1 tablet by mouth every evening.    10/06/2015 at Unknown time  . simvastatin (ZOCOR) 20 MG tablet Take 1 tablet by mouth at bedtime.   10/06/2015 at Unknown time   Allergies:  Allergies  Allergen Reactions  . Codeine Nausea And Vomiting  . Penicillins Rash    Family History  Problem Relation Age of Onset  . Colon cancer Mother   . Pancreatic cancer Father    Social History:  reports that she quit smoking about 44 years ago. Her smoking use included Cigarettes. She has a 1.5 pack-year smoking history. She has never used smokeless tobacco. She reports that she does not drink alcohol or use illicit drugs.  Review of Systems  Genitourinary: Positive for hematuria.  All other systems reviewed and are negative.   Physical Exam:  Vital signs  in last 24 hours: Temp:  [98.4 F (36.9 C)] 98.4 F (36.9 C) (10/10 1035) Pulse Rate:  [90] 90 (10/10 1035) Resp:  [16] 16 (10/10 1035) BP: (132)/(77) 132/77 mmHg (10/10 1035) SpO2:  [98 %] 98 % (10/10 1035) Weight:  [90.719 kg (200 lb)] 90.719 kg (200 lb) (10/10 1035) Physical Exam  Constitutional: She is oriented to person, place, and time. She appears well-developed and well-nourished.  HENT:  Head: Normocephalic and atraumatic.  Eyes: EOM are normal. Pupils are equal, round, and  reactive to light.  Neck: Normal range of motion. No thyromegaly present.  Cardiovascular: Normal rate and regular rhythm.   Respiratory: Effort normal. No respiratory distress.  GI: Soft. She exhibits no distension.  Musculoskeletal: Normal range of motion.  Neurological: She is alert and oriented to person, place, and time.  Skin: Skin is warm and dry.  Psychiatric: She has a normal mood and affect. Her behavior is normal. Judgment and thought content normal.    Laboratory Data:  Results for orders placed or performed during the hospital encounter of 10/07/15 (from the past 24 hour(s))  I-STAT, chem 8     Status: None   Collection Time: 10/07/15 11:48 AM  Result Value Ref Range   Sodium 143 135 - 145 mmol/L   Potassium 4.9 3.5 - 5.1 mmol/L   Chloride 104 101 - 111 mmol/L   BUN 13 6 - 20 mg/dL   Creatinine, Ser 1.61 0.44 - 1.00 mg/dL   Glucose, Bld 93 65 - 99 mg/dL   Calcium, Ion 0.96 0.45 - 1.30 mmol/L   TCO2 30 0 - 100 mmol/L   Hemoglobin 13.9 12.0 - 15.0 g/dL   HCT 40.9 81.1 - 91.4 %   No results found for this or any previous visit (from the past 240 hour(s)). Creatinine:  Recent Labs  10/07/15 1148  CREATININE 0.70   Baseline Creatinine: 0.7  Impression/Assessment:  74yo with nephrolithiasis  Plan:  The risks/benefits/alternatives to R ureteroscopic stone extraction was explained to the patient and she understands and wishes to proceed with surgery.  Misty Ward L 10/07/2015, 12:56 PM

## 2015-10-07 NOTE — Discharge Instructions (Signed)
Remove stent in 72 hours by pulling string ° ° ° °[facility] °[phone] °Post Ureteroscopy With or Without Stent Instructions ° °Definitions: ° °Ureter: The duct that transports urine from the kidney to the bladder. °Stent:   A plastic hollow tube that is placed into the ureter, from the kidney to the bladder to prevent the ureter from swelling shut. ° °GENERAL INSTRUCTIONS: ° °Despite the fact that no skin incisions were used, the area around the ureter and bladder is raw and irritated. The stent is a foreign body which will further irritate the bladder wall. This irritation is manifested by increased frequency of urination, both day and night, and by an increase in the urge to urinate. In some, the urge to urinate is present almost always. Sometimes the urge is strong enough that you may not be able to stop yourself from urinating. The only real cure is to remove the stent and then give time for the bladder wall to heal which can't be done until the danger of the ureter swelling shut has passed, which varies. ° °You may see some blood in your urine while the stent is in place and a few days afterwards. Do not be alarmed, even if the urine was clear for a while. Get off your feet and drink lots of fluids until clearing occurs. If you start to pass clots or don't improve, call us. ° °DIET: °You may return to your normal diet immediately. Because of the raw surface of your bladder, alcohol, spicy foods, acid type foods and drinks with caffeine may cause irritation or frequency and should be used in moderation. To keep your urine flowing freely and to avoid constipation, drink plenty of fluids during the day ( 8-10 glasses ). °Tip: Avoid cranberry juice because it is very acidic. ° °ACTIVITY: °Your physical activity doesn't need to be restricted. However, if you are very active, you may see some blood in your urine. We suggest that you reduce your activity under these circumstances until the  bleeding has stopped. ° °BOWELS: °It is important to keep your bowels regular during the postoperative period. Straining with bowel movements can cause bleeding. A bowel movement every other day is reasonable. Use a mild laxative if needed, such as Milk of Magnesia 2-3 tablespoons, or 2 Dulcolax tablets. Call if you continue to have problems. If you have been taking narcotics for pain, before, during or after your surgery, you may be constipated. Take a laxative if necessary. ° ° °MEDICATION: °You should resume your pre-surgery medications unless told not to. In addition you will often be given an antibiotic to prevent infection. These should be taken as prescribed until the bottles are finished unless you are having an unusual reaction to one of the drugs. ° °PROBLEMS YOU SHOULD REPORT TO US: °· Fevers over 100.5 Fahrenheit. °· Heavy bleeding, or clots ( See above notes about blood in urine ). °· Inability to urinate. °· Drug reactions ( hives, rash, nausea, vomiting, diarrhea ). °· Severe burning or pain with urination that is not improving. ° °FOLLOW-UP: °You will need a follow-up appointment to monitor your progress. Call for this appointment at the number listed above. Usually the first appointment will be about three to fourteen days after your surgery. ° ° ° ° °Post Anesthesia Home Care Instructions ° °Activity: °Get plenty of rest for the remainder of the day. A responsible adult should stay with you for 24 hours following the procedure.  °For the next 24 hours, DO   NOT: °-Drive a car °-Operate machinery °-Drink alcoholic beverages °-Take any medication unless instructed by your physician °-Make any legal decisions or sign important papers. ° °Meals: °Start with liquid foods such as gelatin or soup. Progress to regular foods as tolerated. Avoid greasy, spicy, heavy foods. If nausea and/or vomiting occur, drink only clear liquids until the nausea and/or vomiting subsides. Call your physician if vomiting  continues. ° °Special Instructions/Symptoms: °Your throat may feel dry or sore from the anesthesia or the breathing tube placed in your throat during surgery. If this causes discomfort, gargle with warm salt water. The discomfort should disappear within 24 hours. ° °If you had a scopolamine patch placed behind your ear for the management of post- operative nausea and/or vomiting: ° °1. The medication in the patch is effective for 72 hours, after which it should be removed.  Wrap patch in a tissue and discard in the trash. Wash hands thoroughly with soap and water. °2. You may remove the patch earlier than 72 hours if you experience unpleasant side effects which may include dry mouth, dizziness or visual disturbances. °3. Avoid touching the patch. Wash your hands with soap and water after contact with the patch. °  ° ° ° °

## 2015-10-07 NOTE — Anesthesia Preprocedure Evaluation (Signed)
Anesthesia Evaluation  Patient identified by MRN, date of birth, ID band Patient awake    Reviewed: Allergy & Precautions, NPO status , Patient's Chart, lab work & pertinent test results  History of Anesthesia Complications Negative for: history of anesthetic complications  Airway Mallampati: II  TM Distance: >3 FB Neck ROM: Full    Dental no notable dental hx. (+) Dental Advisory Given, Partial Upper   Pulmonary former smoker,    Pulmonary exam normal breath sounds clear to auscultation       Cardiovascular negative cardio ROS Normal cardiovascular exam Rhythm:Regular Rate:Normal     Neuro/Psych PSYCHIATRIC DISORDERS Anxiety negative neurological ROS     GI/Hepatic negative GI ROS, Neg liver ROS,   Endo/Other  Hypothyroidism Obesity   Renal/GU Renal disease  negative genitourinary   Musculoskeletal negative musculoskeletal ROS (+)   Abdominal (+) + obese,   Peds negative pediatric ROS (+)  Hematology negative hematology ROS (+)   Anesthesia Other Findings   Reproductive/Obstetrics negative OB ROS                             Anesthesia Physical  Anesthesia Plan  ASA: II  Anesthesia Plan: General   Post-op Pain Management:    Induction: Intravenous  Airway Management Planned: LMA  Additional Equipment:   Intra-op Plan:   Post-operative Plan: Extubation in OR  Informed Consent: I have reviewed the patients History and Physical, chart, labs and discussed the procedure including the risks, benefits and alternatives for the proposed anesthesia with the patient or authorized representative who has indicated his/her understanding and acceptance.   Dental advisory given  Plan Discussed with: CRNA  Anesthesia Plan Comments: (Pt states that had severe hip and thigh pain the day after a previous anesthetic.  Will position prior to going to sleep. Use LMA, no sux. )         Anesthesia Quick Evaluation

## 2015-10-07 NOTE — Brief Op Note (Signed)
10/07/2015  1:59 PM  PATIENT:  Misty Ward  74 y.o. female  PRE-OPERATIVE DIAGNOSIS:  RIGHT RENAL STONES  POST-OPERATIVE DIAGNOSIS:  RIGHT RENAL STONES  PROCEDURE:  Procedure(s): CYSTOSCOPY/RETROGRADE/STONE EXTRACTION WITH BASKET AND STENT EXCHANGE (Right) HOLMIUM LASER APPLICATION (Right)  SURGEON:  Surgeon(s) and Role:    * Malen Gauze, MD - Primary  PHYSICIAN ASSISTANT:   ASSISTANTS: none   ANESTHESIA:   general  EBL:  Total I/O In: 800 [I.V.:800] Out: -   BLOOD ADMINISTERED:none  DRAINS: 6x26 JJ stent with tether  LOCAL MEDICATIONS USED:  NONE  SPECIMEN:  Source of Specimen:  stone  DISPOSITION OF SPECIMEN:  N/A  COUNTS:  YES  TOURNIQUET:  * No tourniquets in log *  DICTATION: .Note written in EPIC  PLAN OF CARE: Discharge to home after PACU  PATIENT DISPOSITION:  PACU - hemodynamically stable.   Delay start of Pharmacological VTE agent (>24hrs) due to surgical blood loss or risk of bleeding: not applicable

## 2015-10-08 ENCOUNTER — Encounter (HOSPITAL_BASED_OUTPATIENT_CLINIC_OR_DEPARTMENT_OTHER): Payer: Self-pay | Admitting: Urology

## 2015-10-10 NOTE — Anesthesia Postprocedure Evaluation (Signed)
  Anesthesia Post-op Note  Patient: Misty Ward  Procedure(s) Performed: Procedure(s) (LRB): CYSTOSCOPY/RETROGRADE/STONE EXTRACTION WITH BASKET AND STENT EXCHANGE (Right) HOLMIUM LASER APPLICATION (Right)  Patient Location: PACU  Anesthesia Type: General  Level of Consciousness: awake and alert   Airway and Oxygen Therapy: Patient Spontanous Breathing  Post-op Pain: mild  Post-op Assessment: Post-op Vital signs reviewed, Patient's Cardiovascular Status Stable, Respiratory Function Stable, Patent Airway and No signs of Nausea or vomiting  Last Vitals:  Filed Vitals:   10/07/15 1451  BP: 147/70  Pulse: 65  Temp: 36.6 C  Resp: 18    Post-op Vital Signs: stable   Complications: No apparent anesthesia complications

## 2016-06-05 IMAGING — DX DG ABDOMEN ACUTE W/ 1V CHEST
4 series · 4 of 4 positions shown · non-contrast
Comparison: 07/12/2015

CLINICAL DATA: Nausea vomiting weakness and abdominal pain today.

EXAM:
DG ABDOMEN ACUTE W/ 1V CHEST

[chest pa]
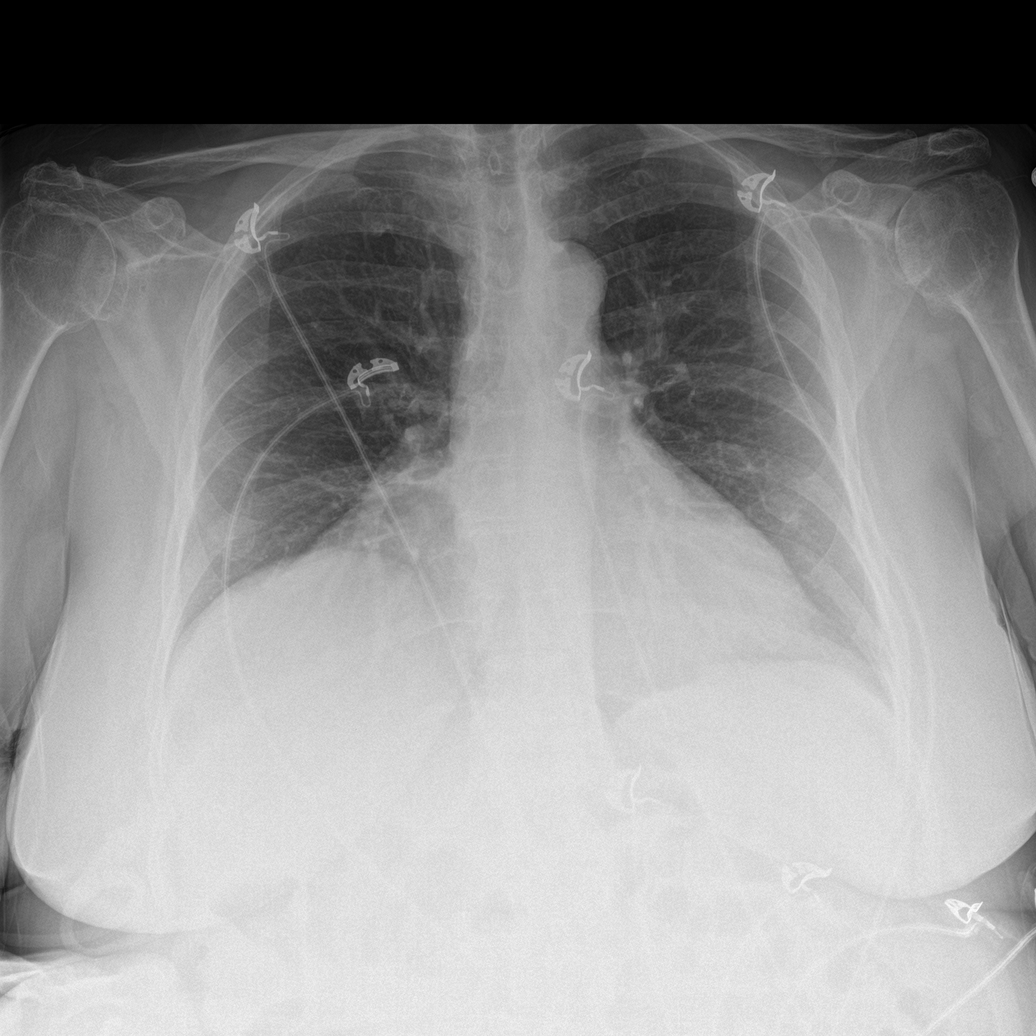

[abdomen erect]
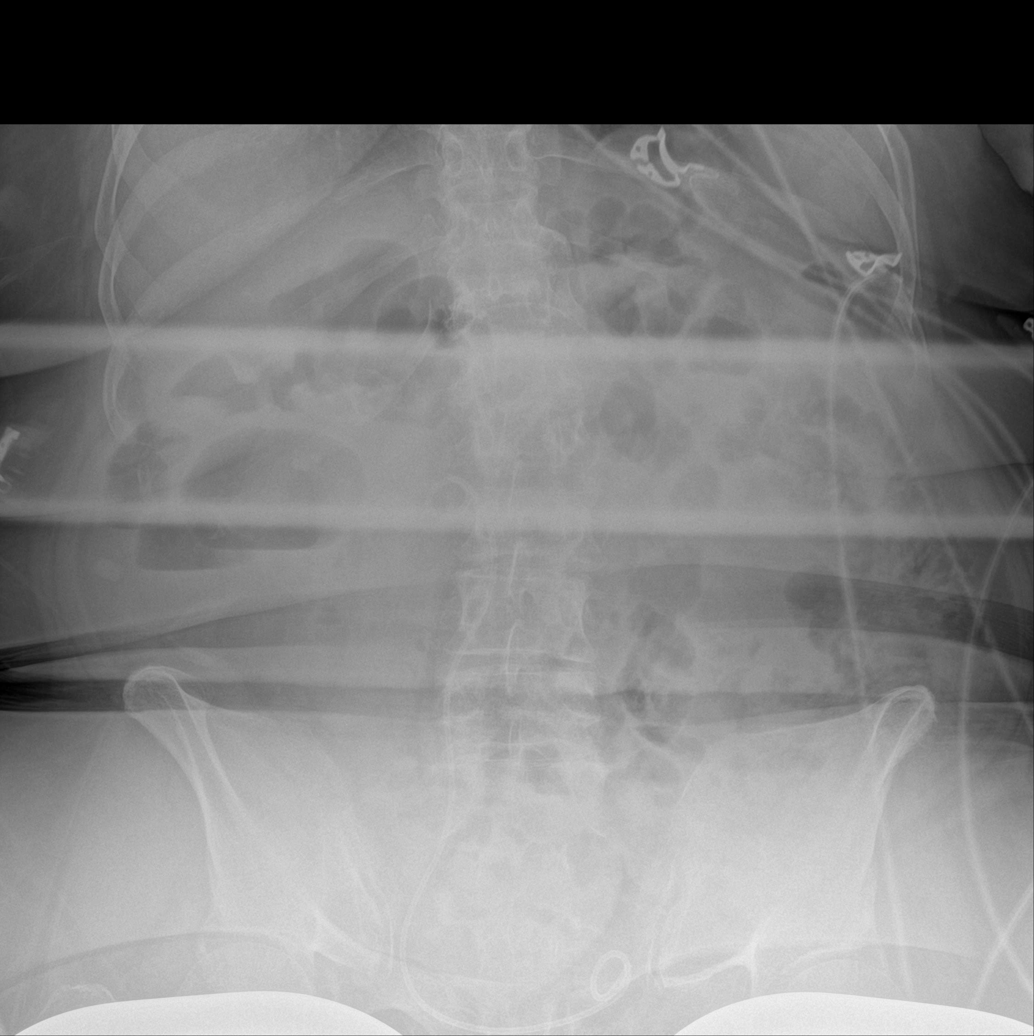

[abdomen supine (1 of 2)]
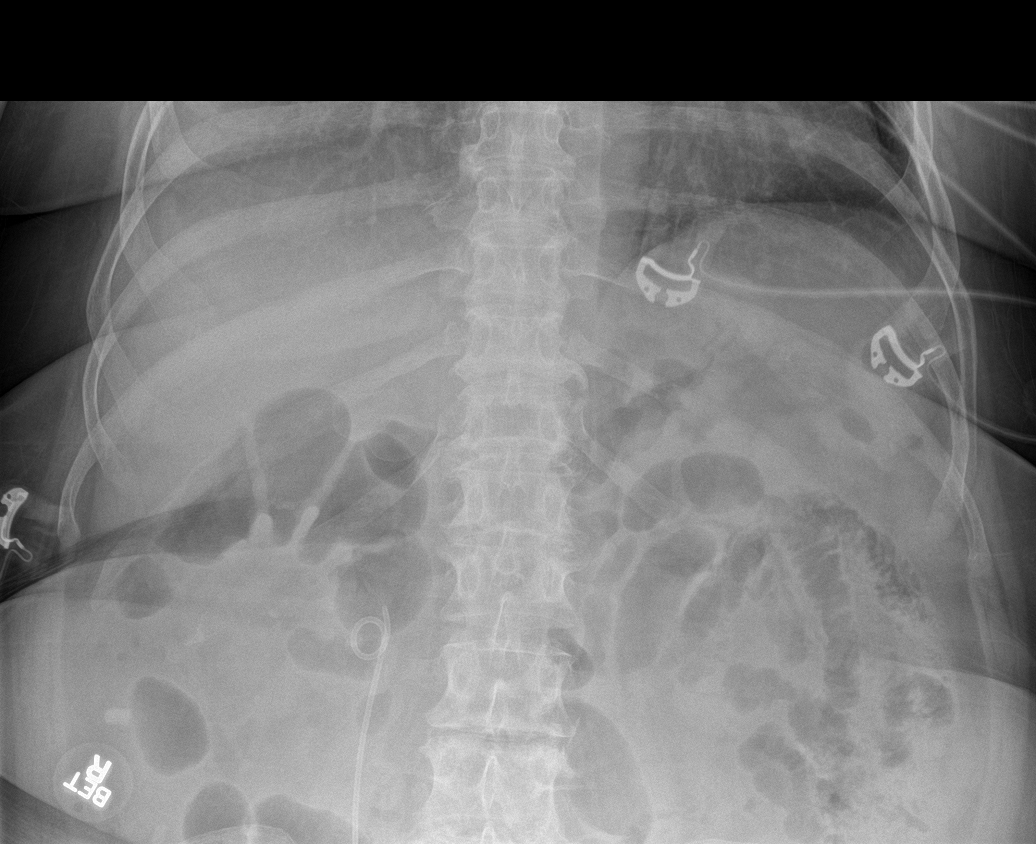

[abdomen supine (2 of 2)]
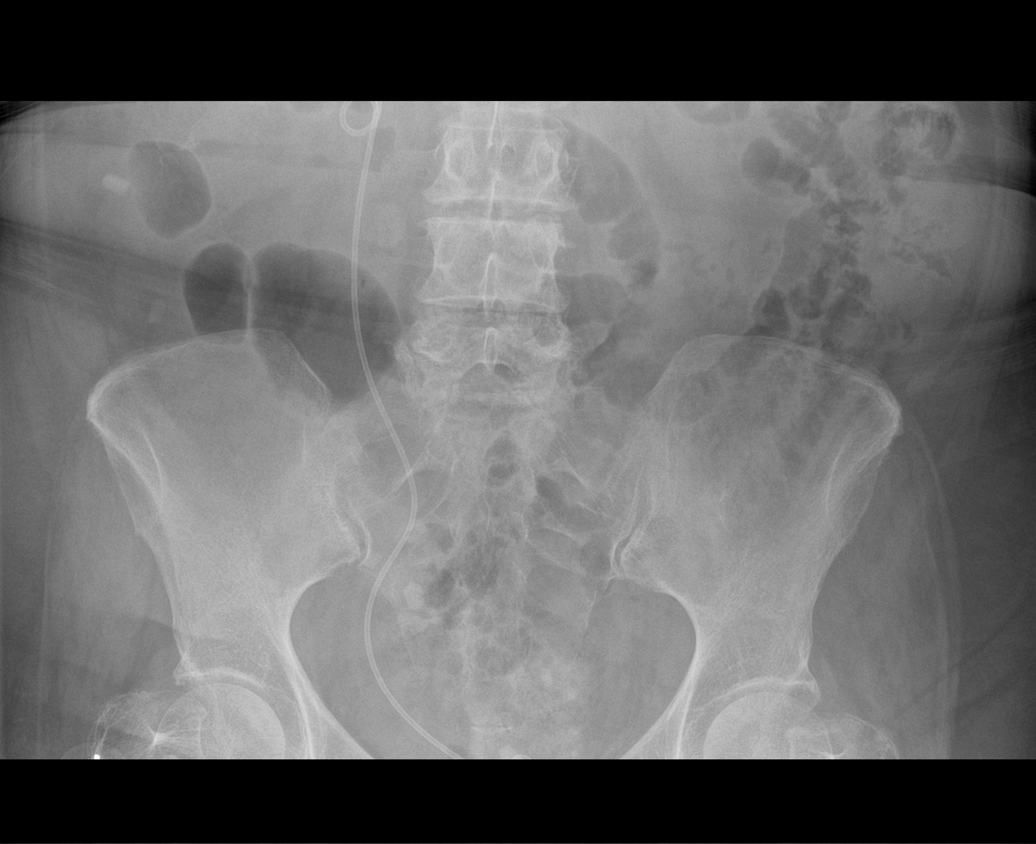

[4 of 4 positions shown; findings below may reference images not displayed]

FINDINGS: There is a right ureteral stent which appears satisfactorily
positioned. No conclusive ureteral calculi. The abdominal gas
pattern is negative for obstruction or perforation. There is mild
right hemidiaphragm elevation, unchanged from the CT of 07/06/2015.
No acute cardiopulmonary findings are evident.
IMPRESSION: Right ureteral stent appears satisfactorily positioned. Unremarkable
bowel gas pattern. No acute cardiopulmonary findings.

## 2021-08-05 ENCOUNTER — Encounter: Payer: Self-pay | Admitting: *Deleted
